# Patient Record
Sex: Female | Born: 1990
Health system: Southern US, Community
[De-identification: ages and names within clinical notes are randomized; demographics above are authoritative.]

## PROBLEM LIST (undated history)

## (undated) ENCOUNTER — Inpatient Hospital Stay (HOSPITAL_COMMUNITY): Payer: Self-pay

## (undated) DIAGNOSIS — N6009 Solitary cyst of unspecified breast: Secondary | ICD-10-CM

## (undated) DIAGNOSIS — D649 Anemia, unspecified: Secondary | ICD-10-CM

## (undated) DIAGNOSIS — F419 Anxiety disorder, unspecified: Secondary | ICD-10-CM

## (undated) DIAGNOSIS — F32A Depression, unspecified: Secondary | ICD-10-CM

## (undated) DIAGNOSIS — T7840XA Allergy, unspecified, initial encounter: Secondary | ICD-10-CM

## (undated) DIAGNOSIS — J45909 Unspecified asthma, uncomplicated: Secondary | ICD-10-CM

## (undated) HISTORY — DX: Depression, unspecified: F32.A

## (undated) HISTORY — PX: COSMETIC SURGERY: SHX468

## (undated) HISTORY — PX: BREAST BIOPSY: SHX20

## (undated) HISTORY — PX: NO PAST SURGERIES: SHX2092

## (undated) HISTORY — DX: Allergy, unspecified, initial encounter: T78.40XA

## (undated) HISTORY — DX: Anxiety disorder, unspecified: F41.9

---
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
## 2013-06-17 ENCOUNTER — Inpatient Hospital Stay (HOSPITAL_COMMUNITY): Payer: BC Managed Care – PPO

## 2013-06-17 ENCOUNTER — Encounter (HOSPITAL_COMMUNITY): Payer: Self-pay

## 2013-06-17 ENCOUNTER — Inpatient Hospital Stay (HOSPITAL_COMMUNITY)
Admission: AD | Admit: 2013-06-17 | Discharge: 2013-06-17 | Disposition: A | Discharge status: Home / Self Care | Payer: BC Managed Care – PPO | Source: Ambulatory Visit | Attending: Obstetrics and Gynecology | Admitting: Obstetrics and Gynecology

## 2013-06-17 DIAGNOSIS — R109 Unspecified abdominal pain: Secondary | ICD-10-CM

## 2013-06-17 DIAGNOSIS — O9989 Other specified diseases and conditions complicating pregnancy, childbirth and the puerperium: Secondary | ICD-10-CM

## 2013-06-17 DIAGNOSIS — O99891 Other specified diseases and conditions complicating pregnancy: Secondary | ICD-10-CM | POA: Insufficient documentation

## 2013-06-17 DIAGNOSIS — R1032 Left lower quadrant pain: Secondary | ICD-10-CM | POA: Insufficient documentation

## 2013-06-17 DIAGNOSIS — O26899 Other specified pregnancy related conditions, unspecified trimester: Secondary | ICD-10-CM

## 2013-06-17 HISTORY — DX: Unspecified asthma, uncomplicated: J45.909

## 2013-06-17 HISTORY — DX: Anemia, unspecified: D64.9

## 2013-06-17 LAB — URINALYSIS, ROUTINE W REFLEX MICROSCOPIC
Bilirubin Urine: NEGATIVE
Glucose, UA: NEGATIVE mg/dL
Ketones, ur: NEGATIVE mg/dL
Leukocytes, UA: NEGATIVE
Nitrite: NEGATIVE
Protein, ur: NEGATIVE mg/dL
Specific Gravity, Urine: <1.005 — ABNORMAL LOW (ref 1.005–1.030)
Urobilinogen, UA: 0.2 mg/dL (ref 0.0–1.0)
pH: 7 (ref 5.0–8.0)

## 2013-06-17 LAB — URINE MICROSCOPIC-ADD ON

## 2013-06-17 LAB — CBC
HCT: 38.4 % (ref 36.0–46.0)
Hemoglobin: 13.1 g/dL (ref 12.0–15.0)
MCH: 30.4 pg (ref 26.0–34.0)
MCHC: 34.1 g/dL (ref 30.0–36.0)
MCV: 89.1 fL (ref 78.0–100.0)
PLATELETS: 241 10*3/uL (ref 150–400)
RBC: 4.31 MIL/uL (ref 3.87–5.11)
RDW: 13 % (ref 11.5–15.5)
WBC: 10.2 10*3/uL (ref 4.0–10.5)

## 2013-06-17 LAB — POCT PREGNANCY, URINE: Preg Test, Ur: POSITIVE — AB

## 2013-06-17 LAB — WET PREP, GENITAL
CLUE CELLS WET PREP: NONE SEEN
Trich, Wet Prep: NONE SEEN
Yeast Wet Prep HPF POC: NONE SEEN

## 2013-06-17 LAB — ABO/RH: ABO/RH(D): O NEG

## 2013-06-17 LAB — HCG, QUANTITATIVE, PREGNANCY: HCG, BETA CHAIN, QUANT, S: 10071 m[IU]/mL — AB (ref <5)

## 2013-06-17 NOTE — Discharge Instructions (Signed)
Pregnancy, First Trimester °The first trimester is the first 3 months your baby is growing inside you. It is important to follow your doctor's instructions. °HOME CARE  °· Do not smoke. °· Do not drink alcohol. °· Only take medicine as told by your doctor. °· Exercise. °· Eat healthy foods. Eat regular, well-balanced meals. °· You can have sex (intercourse) if there are no other problems with the pregnancy. °· Things that help with morning sickness: °· Eat soda crackers before getting up in the morning. °· Eat 4 to 5 small meals rather than 3 large meals. °· Drink liquids between meals, not during meals. °· Go to all appointments as told. °· Take all vitamins or supplements as told by your doctor. °GET HELP RIGHT AWAY IF:  °· You develop a fever. °· You have a bad smelling fluid that is leaking from your vagina. °· There is bleeding from the vagina. °· You develop severe belly (abdominal) or back pain. °· You throw up (vomit) blood. It may look like coffee grounds. °· You lose more than 2 pounds in a week. °· You gain 5 pounds or more in a week. °· You gain more than 2 pounds in a week and you see puffiness (swelling) in your feet, ankles, or legs. °· You have severe dizziness or pass out (faint). °· You are around people who have German measles, chickenpox, or fifth disease. °· You have a headache, watery poop (diarrhea), pain with peeing (urinating), or cannot breath right. °Document Released: 06/19/2007 Document Revised: 03/25/2011 Document Reviewed: 06/19/2007 °ExitCare® Patient Information ©2014 [employer]. ° °

## 2013-06-17 NOTE — MAU Note (Signed)
Patient states she has had positive home pregnancy tests. Started having left lower abdominal pain on 6-2. Has gotten worse, sharp and more constant. Denies bleeding or discharge. Has had nausea, no vomiting.

## 2013-06-17 NOTE — MAU Provider Note (Signed)
History     CSN: [id number]  Arrival date and time: 06/17/13 0910   First Provider Initiated Contact with Patient 06/17/13 1020      Chief Complaint  Patient presents with  . Possible Pregnancy  . Abdominal Pain   HPI [name] is a 23 y.o. G1P0 at [redacted]w[redacted]d who presents to MAU today with complaint of lower abdominal pain in the LLQ. The patient states that she has had off and on cramping with sharp pains since Tuesday. She rates pain at 5/10 now and 7/10 at the worst. She denies vaginal bleeding, abnormal discharge, fever or UTI symptoms today. She states mild nausea without vomiting, diarrhea or constipation.   OB History   Grav Para Term Preterm Abortions TAB SAB Ect Mult Living   1               Past Medical History  Diagnosis Date  . Asthma   . Anemia     Past Surgical History  Procedure Laterality Date  . No past surgeries      Family History  Problem Relation Age of Onset  . Multiple sclerosis Mother     History  Substance Use Topics  . Smoking status: Never Smoker   . Smokeless tobacco: Not on file  . Alcohol Use: No    Allergies:  Allergies  Allergen Reactions  . Shellfish Allergy Hives    No prescriptions prior to admission    Review of Systems  Constitutional: Negative for fever and malaise/fatigue.  Gastrointestinal: Positive for nausea and abdominal pain. Negative for vomiting, diarrhea and constipation.  Genitourinary: Negative for dysuria, urgency and frequency.       Neg - vaginal bleeding, discharge  Neurological: Negative for dizziness, loss of consciousness and weakness.   Physical Exam   Blood pressure 100/57, pulse 91, temperature 99.4 F (37.4 C), temperature source Oral, resp. rate 18, height 5\' 2"  (1.575 m), weight 124 lb (56.246 kg), last menstrual period 05/10/2013, SpO2 100.00%.  Physical Exam  Constitutional: She is oriented to person, place, and time. She appears well-developed and well-nourished. No distress.   HENT:  Head: Normocephalic.  Cardiovascular: Normal rate.   Respiratory: Effort normal.  GI: Soft. She exhibits no distension and no mass. There is tenderness (mild epigastric tenderness to palpation). There is no rebound and no guarding.  Genitourinary: Uterus is not enlarged and not tender. Cervix exhibits friability. Cervix exhibits no motion tenderness and no discharge. Right adnexum displays no mass and no tenderness. Left adnexum displays tenderness (mild). Left adnexum displays no mass. No bleeding around the vagina. Vaginal discharge (scant mucus discharge) found.  Neurological: She is alert and oriented to person, place, and time.  Skin: Skin is warm and dry. No erythema.  Psychiatric: She has a normal mood and affect.   Results for orders placed during the hospital encounter of 06/17/13 (from the past 24 hour(s))  URINALYSIS, ROUTINE W REFLEX MICROSCOPIC     Status: Abnormal   Collection Time    06/17/13  9:45 AM      Result Value Ref Range   Color, Urine YELLOW  YELLOW   APPearance CLEAR  CLEAR   Specific Gravity, Urine <1.005 (*) 1.005 - 1.030   pH 7.0  5.0 - 8.0   Glucose, UA NEGATIVE  NEGATIVE mg/dL   Hgb urine dipstick TRACE (*) NEGATIVE   Bilirubin Urine NEGATIVE  NEGATIVE   Ketones, ur NEGATIVE  NEGATIVE mg/dL   Protein, ur NEGATIVE  NEGATIVE mg/dL  Urobilinogen, UA 0.2  0.0 - 1.0 mg/dL   Nitrite NEGATIVE  NEGATIVE   Leukocytes, UA NEGATIVE  NEGATIVE  URINE MICROSCOPIC-ADD ON     Status: None   Collection Time    06/17/13  9:45 AM      Result Value Ref Range   Squamous Epithelial / LPF RARE  RARE   WBC, UA 0-2  <3 WBC/hpf  POCT PREGNANCY, URINE     Status: Abnormal   Collection Time    06/17/13  9:51 AM      Result Value Ref Range   Preg Test, Ur POSITIVE (*) NEGATIVE  WET PREP, GENITAL     Status: Abnormal   Collection Time    06/17/13 10:29 AM      Result Value Ref Range   Yeast Wet Prep HPF POC NONE SEEN  NONE SEEN   Trich, Wet Prep NONE SEEN  NONE  SEEN   Clue Cells Wet Prep HPF POC NONE SEEN  NONE SEEN   WBC, Wet Prep HPF POC FEW (*) NONE SEEN  CBC     Status: None   Collection Time    06/17/13 11:15 AM      Result Value Ref Range   WBC 10.2  4.0 - 10.5 K/uL   RBC 4.31  3.87 - 5.11 MIL/uL   Hemoglobin 13.1  12.0 - 15.0 g/dL   HCT 16.1  09.6 - 04.5 %   MCV 89.1  78.0 - 100.0 fL   MCH 30.4  26.0 - 34.0 pg   MCHC 34.1  30.0 - 36.0 g/dL   RDW 40.9  81.1 - 91.4 %   Platelets 241  150 - 400 K/uL  ABO/RH     Status: None   Collection Time    06/17/13 11:15 AM      Result Value Ref Range   ABO/RH(D) O NEG    HCG, QUANTITATIVE, PREGNANCY     Status: Abnormal   Collection Time    06/17/13 11:15 AM      Result Value Ref Range   hCG, Beta Chain, Quant, S 10071 (*) <5 mIU/mL   US Ob Comp Less 14 Wks  06/17/2013   CLINICAL DATA:  Right lower quadrant pain  EXAM: OBSTETRIC <14 WK [place] AND TRANSVAGINAL OB US  TECHNIQUE: Both transabdominal and transvaginal ultrasound examinations were performed for complete evaluation of the gestation as well as the maternal uterus, adnexal regions, and pelvic cul-de-sac. Transvaginal technique was performed to assess early pregnancy.  COMPARISON:  None.  FINDINGS: Intrauterine gestational sac: Visualized/normal in shape.  Yolk sac:  Present.  Embryo:  None.  Cardiac Activity: None.  MSD: 9.7 mm   5 w   5  d                 [place] EDC: 02/12/2013  Maternal uterus/adnexae: There is no adnexal mass. Bilateral ovaries are normal in size and echogenicity. The right ovary measures 3.8 x 2.9 x 2.9 cm. The left ovary measures 3.8 x 1.9 x 1.9 cm. There is no pelvic free fluid. There is no subchorionic hemorrhage.  IMPRESSION: Intrauterine gestational sac with a yolk sac. No fetal pole identified. Probable early intrauterine gestational sac, but fetal pole, or cardiac activity yet visualized. Recommend follow-up quantitative B-HCG levels and follow-up US in 14 days to confirm and assess viability. This recommendation follows SRU  consensus guidelines: Diagnostic Criteria for Nonviable Pregnancy Early in the First Trimester. Malva Limes Med 2013; 782:9562-13.   Electronically  Signed   By: Elige KoHetal  Patel   On: 06/17/2013 11:15   [place] Ob Transvaginal  06/17/2013   CLINICAL DATA:  Right lower quadrant pain  EXAM: OBSTETRIC <14 WK US AND TRANSVAGINAL OB US  TECHNIQUE: Both transabdominal and transvaginal ultrasound examinations were performed for complete evaluation of the gestation as well as the maternal uterus, adnexal regions, and pelvic cul-de-sac. Transvaginal technique was performed to assess early pregnancy.  COMPARISON:  None.  FINDINGS: Intrauterine gestational sac: Visualized/normal in shape.  Yolk sac:  Present.  Embryo:  None.  Cardiac Activity: None.  MSD: 9.7 mm   5 w   5  d                 US EDC: 02/12/2013  Maternal uterus/adnexae: There is no adnexal mass. Bilateral ovaries are normal in size and echogenicity. The right ovary measures 3.8 x 2.9 x 2.9 cm. The left ovary measures 3.8 x 1.9 x 1.9 cm. There is no pelvic free fluid. There is no subchorionic hemorrhage.  IMPRESSION: Intrauterine gestational sac with a yolk sac. No fetal pole identified. Probable early intrauterine gestational sac, but fetal pole, or cardiac activity yet visualized. Recommend follow-up quantitative B-HCG levels and follow-up US in 14 days to confirm and assess viability. This recommendation follows SRU consensus guidelines: Diagnostic Criteria for Nonviable Pregnancy Early in the First Trimester. Malva Limes Engl J Med 2013; 409:8119-14; 369:1443-51.   Electronically Signed   By: Elige KoHetal  Patel   On: 06/17/2013 11:15    MAU Course  Procedures None  MDM +UPT UA, wet prep, GC/Chlamydia, CBC, ABO/Rh, quant hCG and US today Discussed results and patient with Dr. Henderson CloudHorvath. Ok for discharge. Follow-up in the office as scheduled or sooner if pain worsens.  Dr. Henderson CloudHorvath states that patient will need follow-up US in the office Assessment and Plan  A: IUGS and YS at  3371w5d Abdominal pain in pregnancy  P: Discharge home Recommended Tylenol PRN pain Patient advised to follow-up as scheduled in the office or sooner if symptoms worsen Patient may return to MAU as needed or if her condition were to change or worsen  Freddi StarrJulie N Ethier, PA-C  06/17/2013, 2:56 PM

## 2013-06-19 LAB — GC/CHLAMYDIA PROBE AMP
CT PROBE, AMP APTIMA: NEGATIVE
GC PROBE AMP APTIMA: NEGATIVE

## 2013-07-26 LAB — OB RESULTS CONSOLE RUBELLA ANTIBODY, IGM: Rubella: IMMUNE

## 2013-07-26 LAB — OB RESULTS CONSOLE HIV ANTIBODY (ROUTINE TESTING): HIV: NONREACTIVE

## 2013-07-26 LAB — OB RESULTS CONSOLE HEPATITIS B SURFACE ANTIGEN: Hepatitis B Surface Ag: NEGATIVE

## 2013-07-26 LAB — OB RESULTS CONSOLE RPR: RPR: NONREACTIVE

## 2013-10-12 LAB — OB RESULTS CONSOLE GC/CHLAMYDIA
Chlamydia: NEGATIVE
Gonorrhea: NEGATIVE

## 2013-11-10 ENCOUNTER — Encounter (HOSPITAL_COMMUNITY): Payer: Self-pay | Admitting: General Practice

## 2013-11-10 ENCOUNTER — Inpatient Hospital Stay (HOSPITAL_COMMUNITY)
Admission: AD | Admit: 2013-11-10 | Discharge: 2013-11-10 | Disposition: A | Discharge status: Home / Self Care | Payer: 59 | Source: Ambulatory Visit | Attending: Obstetrics | Admitting: Obstetrics

## 2013-11-10 DIAGNOSIS — Z3A26 26 weeks gestation of pregnancy: Secondary | ICD-10-CM | POA: Insufficient documentation

## 2013-11-10 DIAGNOSIS — O368120 Decreased fetal movements, second trimester, not applicable or unspecified: Secondary | ICD-10-CM | POA: Insufficient documentation

## 2013-11-10 DIAGNOSIS — O26892 Other specified pregnancy related conditions, second trimester: Secondary | ICD-10-CM | POA: Diagnosis not present

## 2013-11-10 DIAGNOSIS — N898 Other specified noninflammatory disorders of vagina: Secondary | ICD-10-CM | POA: Diagnosis not present

## 2013-11-10 LAB — WET PREP, GENITAL
Clue Cells Wet Prep HPF POC: NONE SEEN
Trich, Wet Prep: NONE SEEN
YEAST WET PREP: NONE SEEN

## 2013-11-10 LAB — URINALYSIS, ROUTINE W REFLEX MICROSCOPIC
BILIRUBIN URINE: NEGATIVE
Glucose, UA: NEGATIVE mg/dL
HGB URINE DIPSTICK: NEGATIVE
KETONES UR: NEGATIVE mg/dL
Leukocytes, UA: NEGATIVE
Nitrite: NEGATIVE
Protein, ur: NEGATIVE mg/dL
SPECIFIC GRAVITY, URINE: 1.01 (ref 1.005–1.030)
UROBILINOGEN UA: 0.2 mg/dL (ref 0.0–1.0)
pH: 7.5 (ref 5.0–8.0)

## 2013-11-10 LAB — AMNISURE RUPTURE OF MEMBRANE (ROM) NOT AT ARMC: Amnisure ROM: NEGATIVE

## 2013-11-10 NOTE — MAU Note (Signed)
Pt presents to mau with c/o decreased fetal movement and leaking fluid.

## 2013-11-10 NOTE — MAU Provider Note (Signed)
History     CSN: [id number]  Arrival date and time: 11/10/13 1131   First Provider Initiated Contact with Patient 11/10/13 1322      Chief Complaint  Patient presents with  . Decreased Fetal Movement   HPI  Alison Moss is a 23 y.o. female G1P0 at 6378w2d who presents with ? ROM and decreased fetal movement. This morning she urinated and when she stood up she felt a gush of fluid; this happened twice. She wore a panty liner into the hospital and it was wet when she came into MAU.  She is feeling fetal movement now that she is here. She was concerned with the babies movements this morning because he is normally very active and this morning he was not. She denies vaginal bleeding.   OB History   Grav Para Term Preterm Abortions TAB SAB Ect Mult Living   1               Past Medical History  Diagnosis Date  . Asthma   . Anemia     Past Surgical History  Procedure Laterality Date  . No past surgeries      Family History  Problem Relation Age of Onset  . Multiple sclerosis Mother     History  Substance Use Topics  . Smoking status: Never Smoker   . Smokeless tobacco: Not on file  . Alcohol Use: No    Allergies:  Allergies  Allergen Reactions  . Shellfish Allergy Hives    Prescriptions prior to admission  Medication Sig Dispense Refill  . Omega-3 Fatty Acids (OMEGA III EPA+DHA) 1000 MG CAPS Take 1 capsule by mouth daily.      . Prenatal Vit-Fe Fumarate-FA (PRENATAL MULTIVITAMIN) TABS tablet Take 1 tablet by mouth daily at 12 noon.       Results for orders placed during the hospital encounter of 11/10/13 (from the past 48 hour(s))  URINALYSIS, ROUTINE W REFLEX MICROSCOPIC     Status: None   Collection Time    11/10/13 12:11 PM      Result Value Ref Range   Color, Urine YELLOW  YELLOW   APPearance CLEAR  CLEAR   Specific Gravity, Urine 1.010  1.005 - 1.030   pH 7.5  5.0 - 8.0   Glucose, UA NEGATIVE  NEGATIVE mg/dL   Hgb urine dipstick NEGATIVE   NEGATIVE   Bilirubin Urine NEGATIVE  NEGATIVE   Ketones, ur NEGATIVE  NEGATIVE mg/dL   Protein, ur NEGATIVE  NEGATIVE mg/dL   Urobilinogen, UA 0.2  0.0 - 1.0 mg/dL   Nitrite NEGATIVE  NEGATIVE   Leukocytes, UA NEGATIVE  NEGATIVE   Comment: MICROSCOPIC NOT DONE ON URINES WITH NEGATIVE PROTEIN, BLOOD, LEUKOCYTES, NITRITE, OR GLUCOSE <1000 mg/dL.  AMNISURE RUPTURE OF MEMBRANE (ROM)     Status: None   Collection Time    11/10/13  1:37 PM      Result Value Ref Range   Amnisure ROM NEGATIVE    WET PREP, GENITAL     Status: Abnormal   Collection Time    11/10/13  1:37 PM      Result Value Ref Range   Yeast Wet Prep HPF POC NONE SEEN  NONE SEEN   Trich, Wet Prep NONE SEEN  NONE SEEN   Clue Cells Wet Prep HPF POC NONE SEEN  NONE SEEN   WBC, Wet Prep HPF POC FEW (*) NONE SEEN   Comment: MANY BACTERIA SEEN    Review of Systems  Gastrointestinal: Negative for abdominal pain (Occasional tightening ).  Genitourinary: Negative for dysuria, urgency and frequency.       + leaking of fluid; clear.    Physical Exam   Blood pressure 105/63, pulse 96, temperature 98 F (36.7 C), temperature source Oral, resp. rate 18, height 5' (1.524 m), weight 58.786 kg (129 lb 9.6 oz), last menstrual period 05/10/2013.  Physical Exam  Constitutional: She is oriented to person, place, and time. She appears well-developed and well-nourished.  HENT:  Head: Normocephalic and atraumatic.  Eyes: Pupils are equal, round, and reactive to light.  Neck: Neck supple.  Respiratory: Effort normal.  GI: Soft. She exhibits no distension. There is no tenderness.  Genitourinary:  Speculum exam: Vagina - moderate amount of creamy discharge, no odor. No pooling of fluid in the vaginal canal  Cervix - No contact bleeding Bimanual exam: Cervix: external os FT, internal os closed   wet prep done Chaperone present for exam.   Neurological: She is alert and oriented to person, place, and time.  Skin: Skin is warm.   Psychiatric: Her behavior is normal.   Fetal Tracing: Baseline: 130 bpm  Variability: Moderate  Accelerations: 15x15 Decelerations: None Toco: None   MAU Course  Procedures None  MDM Wet prep Fern slide negative Amnisure negative  Discussed/ consulted with Dr. Chestine Sporelark. Dr. Chestine Sporelark reviewed fetal tracing. Amnisure negative. Ok to discharge the patient home.   Assessment and Plan   A: 1. Vaginal discharge in pregnancy in second trimester    P: Discharge home in stable condition Return to MAU if symptoms worsen Kick counts Follow up with Dr. Chestine Sporelark as scheduled  Preterm labor precautions   Alison HansenJennifer Irene Cele Mote, NP 11/10/2013 3:34 PM

## 2013-11-15 ENCOUNTER — Encounter (HOSPITAL_COMMUNITY): Payer: Self-pay | Admitting: General Practice

## 2013-12-28 ENCOUNTER — Inpatient Hospital Stay (HOSPITAL_COMMUNITY)
Admission: AD | Admit: 2013-12-28 | Discharge: 2013-12-28 | Disposition: A | Discharge status: Home / Self Care | Payer: 59 | Source: Ambulatory Visit | Attending: Obstetrics and Gynecology | Admitting: Obstetrics and Gynecology

## 2013-12-28 ENCOUNTER — Encounter (HOSPITAL_COMMUNITY): Payer: Self-pay | Admitting: *Deleted

## 2013-12-28 DIAGNOSIS — Z3A33 33 weeks gestation of pregnancy: Secondary | ICD-10-CM | POA: Insufficient documentation

## 2013-12-28 DIAGNOSIS — O4703 False labor before 37 completed weeks of gestation, third trimester: Secondary | ICD-10-CM

## 2013-12-28 LAB — URINALYSIS, ROUTINE W REFLEX MICROSCOPIC
Bilirubin Urine: NEGATIVE
Glucose, UA: NEGATIVE mg/dL
Hgb urine dipstick: NEGATIVE
KETONES UR: NEGATIVE mg/dL
Leukocytes, UA: NEGATIVE
Nitrite: NEGATIVE
PH: 7 (ref 5.0–8.0)
Protein, ur: NEGATIVE mg/dL
Specific Gravity, Urine: 1.01 (ref 1.005–1.030)
Urobilinogen, UA: 0.2 mg/dL (ref 0.0–1.0)

## 2013-12-28 LAB — WET PREP, GENITAL
Clue Cells Wet Prep HPF POC: NONE SEEN
Trich, Wet Prep: NONE SEEN
YEAST WET PREP: NONE SEEN

## 2013-12-28 MED ORDER — LACTATED RINGERS IV BOLUS (SEPSIS)
1000.0000 mL | Freq: Once | INTRAVENOUS | Status: AC
  Administered 2013-12-28: 1000 mL via INTRAVENOUS

## 2013-12-28 MED ORDER — NIFEDIPINE 10 MG PO CAPS
10.0000 mg | ORAL_CAPSULE | ORAL | Status: AC | PRN
  Administered 2013-12-28 (×4): 10 mg via ORAL
  Filled 2013-12-28 (×4): qty 1

## 2013-12-28 MED ORDER — BETAMETHASONE SOD PHOS & ACET 6 (3-3) MG/ML IJ SUSP
12.0000 mg | Freq: Once | INTRAMUSCULAR | Status: AC
  Administered 2013-12-28: 12 mg via INTRAMUSCULAR
  Filled 2013-12-28: qty 2

## 2013-12-28 NOTE — MAU Note (Signed)
Pt. Here for evaluation. Unsure about contraction frequency. Was seen by Dr. Henderson CloudHorvath today and was told to come here and be evaluated. Pt. Also states she was possibly a fingertip on exam. Denies any bleeding. Did have spotting last week. Pt. States she is having "regular discharge" and last week possibly lost mucous. Denies any other abnormal discharge. Baby is moving well. Next appointment is scheduled in two weeks with primary provider.

## 2013-12-28 NOTE — Discharge Instructions (Signed)
Preterm Labor Information °Preterm labor is when labor starts at less than 37 weeks of pregnancy. The normal length of a pregnancy is 39 to 41 weeks. °CAUSES °Often, there is no identifiable underlying cause as to why a woman goes into preterm labor. One of the most common known causes of preterm labor is infection. Infections of the uterus, cervix, vagina, amniotic sac, bladder, kidney, or even the lungs (pneumonia) can cause labor to start. Other suspected causes of preterm labor include:  °· Urogenital infections, such as yeast infections and bacterial vaginosis.   °· Uterine abnormalities (uterine shape, uterine septum, fibroids, or bleeding from the placenta).   °· A cervix that has been operated on (it may fail to stay closed).   °· Malformations in the fetus.   °· Multiple gestations (twins, triplets, and so on).   °· Breakage of the amniotic sac.   °RISK FACTORS °· Having a previous history of preterm labor.   °· Having premature rupture of membranes (PROM).   °· Having a placenta that covers the opening of the cervix (placenta previa).   °· Having a placenta that separates from the uterus (placental abruption).   °· Having a cervix that is too weak to hold the fetus in the uterus (incompetent cervix).   °· Having too much fluid in the amniotic sac (polyhydramnios).   °· Taking illegal drugs or smoking while pregnant.   °· Not gaining enough weight while pregnant.   °· Being younger than 18 and older than 23 years old.   °· Having a low socioeconomic status.   °· Being African American. °SYMPTOMS °Signs and symptoms of preterm labor include:  °· Menstrual-like cramps, abdominal pain, or back pain. °· Uterine contractions that are regular, as frequent as six in an hour, regardless of their intensity (may be mild or painful). °· Contractions that start on the top of the uterus and spread down to the lower abdomen and back.   °· A sense of increased pelvic pressure.   °· A watery or bloody mucus discharge that  comes from the vagina.   °TREATMENT °Depending on the length of the pregnancy and other circumstances, your health care provider may suggest bed rest. If necessary, there are medicines that can be given to stop contractions and to mature the fetal lungs. If labor happens before 34 weeks of pregnancy, a prolonged hospital stay may be recommended. Treatment depends on the condition of both you and the fetus.  °WHAT SHOULD YOU DO IF YOU THINK YOU ARE IN PRETERM LABOR? °Call your health care provider right away. You will need to go to the hospital to get checked immediately. °HOW CAN YOU PREVENT PRETERM LABOR IN FUTURE PREGNANCIES? °You should:  °· Stop smoking if you smoke.  °· Maintain healthy weight gain and avoid chemicals and drugs that are not necessary. °· Be watchful for any type of infection. °· Inform your health care provider if you have a known history of preterm labor. °Document Released: 03/23/2003 Document Revised: 09/02/2012 Document Reviewed: 02/03/2012 °ExitCare® Patient Information ©2015 [employer]. This information is not intended to replace advice given to you by your health care provider. Make sure you discuss any questions you have with your health care provider. ° °Pelvic Rest °Pelvic rest is sometimes recommended for women when:  °· The placenta is partially or completely covering the opening of the cervix (placenta previa). °· There is bleeding between the uterine wall and the amniotic sac in the first trimester (subchorionic hemorrhage). °· The cervix begins to open without labor starting (incompetent cervix, cervical insufficiency). °· The labor is too early (preterm   labor). °HOME CARE INSTRUCTIONS °· Do not have sexual intercourse, stimulation, or an orgasm. °· Do not use tampons, douche, or put anything in the vagina. °· Do not lift anything over 10 pounds (4.5 kg). °· Avoid strenuous activity or straining your pelvic muscles. °SEEK MEDICAL CARE IF:  °· You have any vaginal bleeding  during pregnancy. Treat this as a potential emergency. °· You have cramping pain felt low in the stomach (stronger than menstrual cramps). °· You notice vaginal discharge (watery, mucus, or bloody). °· You have a low, dull backache. °· There are regular contractions or uterine tightening. °SEEK IMMEDIATE MEDICAL CARE IF: °You have vaginal bleeding and have placenta previa.  °Document Released: 04/27/2010 Document Revised: 03/25/2011 Document Reviewed: 04/27/2010 °ExitCare® Patient Information ©2015 [employer]. This information is not intended to replace advice given to you by your health care provider. Make sure you discuss any questions you have with your health care provider. ° °

## 2013-12-28 NOTE — MAU Note (Signed)
Pt states was at MD office today for routine appt. Was told she was ft dilated per Dr. Henderson CloudHorvath. Has had irregular contractions. Has seen mucus like d/c and some spotting.

## 2013-12-28 NOTE — MAU Provider Note (Signed)
Chief Complaint:  Contractions  First Provider Initiated Contact with Patient 12/28/13 1832     HPI: Alison Moss is a 23 y.o. G1P0 at 5219w1d who [facility]. Was seen by Dr. Henderson CloudHorvath today.  Cervix ft/1 cm/short. Pt reports UC's x 1 week. Denies fever, chills, vaginal discharge, leakage of fluid or vaginal bleeding. Good fetal movement.   UA neg in office today. fFN not done.   Past Medical History: Past Medical History  Diagnosis Date  . Asthma   . Anemia     Past obstetric history: OB History  Gravida Para Term Preterm AB SAB TAB Ectopic Multiple Living  1             # Outcome Date GA Lbr Len/2nd Weight Sex Delivery Anes PTL Lv  1 Current               Past Surgical History: Past Surgical History  Procedure Laterality Date  . No past surgeries       Family History: Family History  Problem Relation Age of Onset  . Multiple sclerosis Mother     Social History: History  Substance Use Topics  . Smoking status: Never Smoker   . Smokeless tobacco: Not on file  . Alcohol Use: No    Allergies:  Allergies  Allergen Reactions  . Shellfish Allergy Hives    Meds:  Prescriptions prior to admission  Medication Sig Dispense Refill Last Dose  . Omega-3 Fatty Acids (OMEGA III EPA+DHA) 1000 MG CAPS Take 1 capsule by mouth daily.   Past Week at Unknown time  . Prenatal Vit-Fe Fumarate-FA (PRENATAL MULTIVITAMIN) TABS tablet Take 1 tablet by mouth daily at 12 noon.   12/27/2013 at Unknown time    ROS: Pertinent findings in history of present illness. Denies fever, chills, vaginal discharge, leakage of fluid, vaginal bleeding, or urinary complaints.  Physical Exam  Blood pressure 104/64, pulse 83, temperature 97.7 F (36.5 C), temperature source Oral, resp. rate 18, last menstrual period 05/10/2013. GENERAL: Well-developed, well-nourished female in no acute distress.  HEENT: normocephalic HEART: normal  rate RESP: normal effort ABDOMEN: Soft, non-tender, gravid appropriate for gestational age EXTREMITIES: Nontender, no edema NEURO: alert and oriented SPECULUM EXAM: Deferred  Dilation: 1.5 Cervical Position: Posterior Exam by:: Ivonne AndrewV. Trevonne Nyland, CNM1/50/-3, posterior, soft, VTX  FHT:  Baseline 140 , moderate variability, accelerations present, no decelerations Contractions: q 1.5-2 mins   Labs: Results for orders placed or performed during the hospital encounter of 12/28/13 (from the past 24 hour(s))  Urinalysis, Routine w reflex microscopic     Status: None   Collection Time: 12/28/13  5:33 PM  Result Value Ref Range   Color, Urine YELLOW YELLOW   APPearance CLEAR CLEAR   Specific Gravity, Urine 1.010 1.005 - 1.030   pH 7.0 5.0 - 8.0   Glucose, UA NEGATIVE NEGATIVE mg/dL   Hgb urine dipstick NEGATIVE NEGATIVE   Bilirubin Urine NEGATIVE NEGATIVE   Ketones, ur NEGATIVE NEGATIVE mg/dL   Protein, ur NEGATIVE NEGATIVE mg/dL   Urobilinogen, UA 0.2 0.0 - 1.0 mg/dL   Nitrite NEGATIVE NEGATIVE   Leukocytes, UA NEGATIVE NEGATIVE  Wet prep, genital     Status: Abnormal   Collection Time: 12/28/13  6:40 PM  Result Value Ref Range   Yeast Wet Prep HPF POC NONE SEEN NONE SEEN   Trich, Wet Prep NONE SEEN NONE SEEN   Clue Cells Wet Prep HPF POC NONE SEEN NONE  SEEN   WBC, Wet Prep HPF POC FEW (A) NONE SEEN    Imaging:  No results found.   MAU Course: BMZ, Procardia, push fluids.   UC's much less frequent and intense. Now tracing only frequent UI. Dilation: 1.5 Cervical Position: Posterior Exam by:: Ivonne AndrewV. Ahren Pettinger, CNM  Dr. Dareen PianoAnderson notified for small cervical change. IV bolus ordered.  Not feeling UC's.  No further cervical change.  Assessment: 1. Preterm contractions, third trimester    Plan: Discharge home in stable condition per consult w/ Dr. Dareen PianoAnderson.  Preterm Labor precautions and fetal kick counts. Push fluids and rest.       Follow-up Information    [facility] On 12[facility]/2013.   Why:  at 6:45 pm for second steroid injection   Contact information:   [address] [id number] [place] [id number] [phone]      Follow up with Levi AlandANDERSON,MARK E, MD.   Specialty:  Obstetrics and Gynecology   Why:  to schedule next appointment   Contact information:   [address] [place] [id number] [phone]        Medication List    TAKE these medications        OMEGA III EPA+DHA 1000 MG Caps  Take 1 capsule by mouth daily.     prenatal multivitamin Tabs tablet  Take 1 tablet by mouth daily at 12 noon.        [place]ole[place]ia Kassy Mcenroe, CNM 12/28/2013 9:08 PM

## 2013-12-28 NOTE — MAU Note (Signed)
[name], CNM at bedside. IV infusing well. Able to be discharged after IV is complete. Pt. And SO agreeable to plan of care.

## 2013-12-29 ENCOUNTER — Inpatient Hospital Stay (HOSPITAL_COMMUNITY)
Admission: AD | Admit: 2013-12-29 | Discharge: 2013-12-29 | Disposition: A | Discharge status: Home / Self Care | Payer: 59 | Source: Ambulatory Visit | Attending: Obstetrics and Gynecology | Admitting: Obstetrics and Gynecology

## 2013-12-29 DIAGNOSIS — Z3A33 33 weeks gestation of pregnancy: Secondary | ICD-10-CM | POA: Diagnosis not present

## 2013-12-29 MED ORDER — BETAMETHASONE SOD PHOS & ACET 6 (3-3) MG/ML IJ SUSP
12.0000 mg | Freq: Once | INTRAMUSCULAR | Status: AC
  Administered 2013-12-29: 12 mg via INTRAMUSCULAR
  Filled 2013-12-29: qty 2

## 2013-12-29 NOTE — MAU Note (Signed)
Pt presents for second betamethasone injection. Denies pain or bleeding. Reports good fetal movement.

## 2013-12-30 ENCOUNTER — Encounter (HOSPITAL_COMMUNITY): Payer: Self-pay

## 2013-12-30 ENCOUNTER — Inpatient Hospital Stay (HOSPITAL_COMMUNITY)
Admission: AD | Admit: 2013-12-30 | Discharge: 2013-12-30 | Disposition: A | Discharge status: Home / Self Care | Payer: 59 | Source: Ambulatory Visit | Attending: Obstetrics and Gynecology | Admitting: Obstetrics and Gynecology

## 2013-12-30 DIAGNOSIS — O368130 Decreased fetal movements, third trimester, not applicable or unspecified: Secondary | ICD-10-CM | POA: Insufficient documentation

## 2013-12-30 DIAGNOSIS — O9989 Other specified diseases and conditions complicating pregnancy, childbirth and the puerperium: Secondary | ICD-10-CM | POA: Diagnosis present

## 2013-12-30 DIAGNOSIS — O4703 False labor before 37 completed weeks of gestation, third trimester: Secondary | ICD-10-CM

## 2013-12-30 DIAGNOSIS — Z3A33 33 weeks gestation of pregnancy: Secondary | ICD-10-CM | POA: Insufficient documentation

## 2013-12-30 LAB — URINALYSIS, ROUTINE W REFLEX MICROSCOPIC
Bilirubin Urine: NEGATIVE
GLUCOSE, UA: NEGATIVE mg/dL
Ketones, ur: 15 mg/dL — AB
LEUKOCYTES UA: NEGATIVE
NITRITE: NEGATIVE
PROTEIN: NEGATIVE mg/dL
Specific Gravity, Urine: 1.01 (ref 1.005–1.030)
Urobilinogen, UA: 0.2 mg/dL (ref 0.0–1.0)
pH: 6 (ref 5.0–8.0)

## 2013-12-30 LAB — URINE MICROSCOPIC-ADD ON

## 2013-12-30 LAB — AMNISURE RUPTURE OF MEMBRANE (ROM) NOT AT ARMC: Amnisure ROM: NEGATIVE

## 2013-12-30 LAB — FETAL FIBRONECTIN: FETAL FIBRONECTIN: NEGATIVE

## 2013-12-30 MED ORDER — LACTATED RINGERS IV BOLUS (SEPSIS)
1000.0000 mL | Freq: Once | INTRAVENOUS | Status: AC
  Administered 2013-12-30: 1000 mL via INTRAVENOUS

## 2013-12-30 MED ORDER — NIFEDIPINE 10 MG PO CAPS
20.0000 mg | ORAL_CAPSULE | Freq: Once | ORAL | Status: AC
Start: 2013-12-30 — End: 2013-12-30
  Administered 2013-12-30: 20 mg via ORAL
  Filled 2013-12-30: qty 2

## 2013-12-30 MED ORDER — NIFEDIPINE 10 MG PO CAPS: 10.0000 mg | ORAL_CAPSULE | ORAL | Status: DC | PRN

## 2013-12-30 NOTE — MAU Note (Signed)
Pt states still feels moisture in vaginal area at present. Woke up with wet panties. Feels like she can feel cervix dilating.

## 2013-12-30 NOTE — Discharge Instructions (Signed)
Pelvic Rest °Pelvic rest is sometimes recommended for women when:  °· The placenta is partially or completely covering the opening of the cervix (placenta previa). °· There is bleeding between the uterine wall and the amniotic sac in the first trimester (subchorionic hemorrhage). °· The cervix begins to open without labor starting (incompetent cervix, cervical insufficiency). °· The labor is too early (preterm labor). °HOME CARE INSTRUCTIONS °· Do not have sexual intercourse, stimulation, or an orgasm. °· Do not use tampons, douche, or put anything in the vagina. °· Do not lift anything over 10 pounds (4.5 kg). °· Avoid strenuous activity or straining your pelvic muscles. °SEEK MEDICAL CARE IF:  °· You have any vaginal bleeding during pregnancy. Treat this as a potential emergency. °· You have cramping pain felt low in the stomach (stronger than menstrual cramps). °· You notice vaginal discharge (watery, mucus, or bloody). °· You have a low, dull backache. °· There are regular contractions or uterine tightening. °SEEK IMMEDIATE MEDICAL CARE IF: °You have vaginal bleeding and have placenta previa.  °Document Released: 04/27/2010 Document Revised: 03/25/2011 Document Reviewed: 04/27/2010 °ExitCare® Patient Information ©2015 [employer]. This information is not intended to replace advice given to you by your health care provider. Make sure you discuss any questions you have with your health care provider. ° °Fetal Fibronectin °This is a test done to help evaluate a pregnant woman's risk of pre-term delivery. It is generally done when you are 26 to [redacted] weeks pregnant and are having symptoms of premature labor. A Dacron swab is used to take a sample of cervical or vaginal fluid from the back portion of the vagina or from the area just outside the opening of the cervix. °Fetal fibronectin (fFN) is a glycoprotein that can be used to help predict the short term risk of premature delivery. fFN is produced at the boundary  between the amniotic sac and the lining of the mother's uterus. This is called the unteroplacental junction. Fetal fibronectin is largely confined to this junction and thought to help maintain the integrity of the boundary. fFN is normally detectable in cervicovaginal fluid during the first 20 to 24 weeks of pregnancy, and then is detectable again after about 36 weeks.  °Finding fFN in cervicovaginal fluids after 36 weeks is not unusual as it is often released by the body as it gets ready for childbirth. The elevated fFN found in vaginal fluids early in pregnancy may simply reflect the normal growth and establishment of tissues at the unteroplacental junction with levels falling when this phase is complete. What is known is that fFN that is detected between 24 and 36 weeks of pregnancy is not normal. Elevated levels reflect a disturbance at the uteroplacental junction and have been associated with an increased risk of pre-term labor and delivery. Knowing whether or not a woman is likely to deliver prematurely helps your caregiver plan a course of action. The fFN test is a relatively non-invasive tool to help the caregiver to distinguish between those who are likely to deliver shortly and those who are not.  °PREPARATION FOR TEST  °· Inform the person conducting the test if you have a medical condition or are using any medications that cause excessive bleeding. °· Do not have sexual intercourse for 24 hours before the procedure. °NORMAL FINDINGS  °Pregnancy = 50 nanograms/ml °Ranges for normal findings may vary among different laboratories and hospitals. You should always check with your doctor after having lab work or other tests done to discuss the meaning   of your test results and whether your values are considered within normal limits. °MEANING OF TEST  °Your caregiver will go over the test results with you and discuss the importance and meaning of your results, as well as treatment options and the need for  additional tests if necessary. °OBTAINING THE TEST RESULTS  °It is your responsibility to obtain your test results. Ask the lab or department performing the test when and how you will get your results. °Document Released: 11/02/2003 Document Revised: 03/25/2011 Document Reviewed: 03/29/2013 °ExitCare® Patient Information ©2015 [employer]. This information is not intended to replace advice given to you by your health care provider. Make sure you discuss any questions you have with your health care provider. ° °Preterm Labor Information °Preterm labor is when labor starts at less than 37 weeks of pregnancy. The normal length of a pregnancy is 39 to 41 weeks. °CAUSES °Often, there is no identifiable underlying cause as to why a woman goes into preterm labor. One of the most common known causes of preterm labor is infection. Infections of the uterus, cervix, vagina, amniotic sac, bladder, kidney, or even the lungs (pneumonia) can cause labor to start. Other suspected causes of preterm labor include:  °· Urogenital infections, such as yeast infections and bacterial vaginosis.   °· Uterine abnormalities (uterine shape, uterine septum, fibroids, or bleeding from the placenta).   °· A cervix that has been operated on (it may fail to stay closed).   °· Malformations in the fetus.   °· Multiple gestations (twins, triplets, and so on).   °· Breakage of the amniotic sac.   °RISK FACTORS °· Having a previous history of preterm labor.   °· Having premature rupture of membranes (PROM).   °· Having a placenta that covers the opening of the cervix (placenta previa).   °· Having a placenta that separates from the uterus (placental abruption).   °· Having a cervix that is too weak to hold the fetus in the uterus (incompetent cervix).   °· Having too much fluid in the amniotic sac (polyhydramnios).   °· Taking illegal drugs or smoking while pregnant.   °· Not gaining enough weight while pregnant.   °· Being younger than 18 and older  than 23 years old.   °· Having a low socioeconomic status.   °· Being African American. °SYMPTOMS °Signs and symptoms of preterm labor include:  °· Menstrual-like cramps, abdominal pain, or back pain. °· Uterine contractions that are regular, as frequent as six in an hour, regardless of their intensity (may be mild or painful). °· Contractions that start on the top of the uterus and spread down to the lower abdomen and back.   °· A sense of increased pelvic pressure.   °· A watery or bloody mucus discharge that comes from the vagina.   °TREATMENT °Depending on the length of the pregnancy and other circumstances, your health care provider may suggest bed rest. If necessary, there are medicines that can be given to stop contractions and to mature the fetal lungs. If labor happens before 34 weeks of pregnancy, a prolonged hospital stay may be recommended. Treatment depends on the condition of both you and the fetus.  °WHAT SHOULD YOU DO IF YOU THINK YOU ARE IN PRETERM LABOR? °Call your health care provider right away. You will need to go to the hospital to get checked immediately. °HOW CAN YOU PREVENT PRETERM LABOR IN FUTURE PREGNANCIES? °You should:  °· Stop smoking if you smoke.  °· Maintain healthy weight gain and avoid chemicals and drugs that are not necessary. °· Be watchful for any type of   infection. °· Inform your health care provider if you have a known history of preterm labor. °Document Released: 03/23/2003 Document Revised: 09/02/2012 Document Reviewed: 02/03/2012 °ExitCare® Patient Information ©2015 [employer]. This information is not intended to replace advice given to you by your health care provider. Make sure you discuss any questions you have with your health care provider. ° °

## 2013-12-30 NOTE — MAU Note (Signed)
Pt was seen on Monday for preterm ctx. Ctx stopped  And went home. Pt stated she woke up today and her panties were wet. C/o some lower back pain and "pinching on her cervix". Also c/o decreased fetal movment. MD told her to come in.

## 2013-12-30 NOTE — MAU Provider Note (Signed)
History     CSN: [id number]  Arrival date and time: 12/30/13 1119   First Provider Initiated Contact with Patient 12/30/13 1223      Chief Complaint  Patient presents with  . Rupture of Membranes   HPI Alison Moss 23 y.o. G1P0 @[redacted]w[redacted]d  presents to MAU with complaint of continuing contractions and possible leakage of fluid. When she awoke this am, she noticed dampness in underwear and has been wearing a panty liner all day.  She reports every time she changes to get dry, it becomes wet again right away.  No gush or stream of fluid.  No vaginal bleeding since spotting last week.  No dysuria.  Decreased fetal movement today.  She is beginning to feel movement while we talk.  She denies fever and weakness but feels flushed.   OB History    Gravida Para Term Preterm AB TAB SAB Ectopic Multiple Living   1               Past Medical History  Diagnosis Date  . Asthma   . Anemia     Past Surgical History  Procedure Laterality Date  . No past surgeries      Family History  Problem Relation Age of Onset  . Multiple sclerosis Mother     History  Substance Use Topics  . Smoking status: Never Smoker   . Smokeless tobacco: Never Used  . Alcohol Use: No    Allergies:  Allergies  Allergen Reactions  . Shellfish Allergy Hives    Prescriptions prior to admission  Medication Sig Dispense Refill Last Dose  . Omega-3 Fatty Acids (OMEGA III EPA+DHA) 1000 MG CAPS Take 1 capsule by mouth daily.   Past Week at Unknown time  . Prenatal Vit-Fe Fumarate-FA (PRENATAL MULTIVITAMIN) TABS tablet Take 1 tablet by mouth daily at 12 noon.   12/27/2013 at Unknown time    ROS Pertinent ROS in HPI Physical Exam   Blood pressure 110/54, pulse 92, temperature 98.1 F (36.7 C), resp. rate 18, height 5\' 1"  (1.549 m), weight 137 lb 3.2 oz (62.234 kg), last menstrual period 05/10/2013.  Physical Exam  Constitutional: She is oriented to person, place, and time. She appears well-developed and  well-nourished.  HENT:  Head: Normocephalic and atraumatic.  Eyes: EOM are normal.  Neck: Normal range of motion.  Cardiovascular: Normal rate, regular rhythm and normal heart sounds.   Respiratory: Effort normal and breath sounds normal. No respiratory distress.  GI: Soft. Bowel sounds are normal. She exhibits no distension. There is no tenderness.  Genitourinary:  Vagina with white discharge.  Once this is cleaned away, pt asked to valsalva forcefully.  No pooling noted.   Also no bleeding.  Cervix is 1.5cm dilated, thick.    Musculoskeletal: Normal range of motion.  Neurological: She is alert and oriented to person, place, and time.  Skin: Skin is warm and dry.  Psychiatric: She has a normal mood and affect.   Results for orders placed or performed during the hospital encounter of 12/30/13 (from the past 24 hour(s))  Urinalysis, Routine w reflex microscopic     Status: Abnormal   Collection Time: 12/30/13 11:30 AM  Result Value Ref Range   Color, Urine YELLOW YELLOW   APPearance CLEAR CLEAR   Specific Gravity, Urine 1.010 1.005 - 1.030   pH 6.0 5.0 - 8.0   Glucose, UA NEGATIVE NEGATIVE mg/dL   Hgb urine dipstick TRACE (A) NEGATIVE   Bilirubin Urine NEGATIVE NEGATIVE  Ketones, ur 15 (A) NEGATIVE mg/dL   Protein, ur NEGATIVE NEGATIVE mg/dL   Urobilinogen, UA 0.2 0.0 - 1.0 mg/dL   Nitrite NEGATIVE NEGATIVE   Leukocytes, UA NEGATIVE NEGATIVE  Urine microscopic-add on     Status: None   Collection Time: 12/30/13 11:30 AM  Result Value Ref Range   Squamous Epithelial / LPF RARE RARE   RBC / HPF 0-2 <3 RBC/hpf  Amnisure rupture of membrane (rom)     Status: None   Collection Time: 12/30/13 12:45 PM  Result Value Ref Range   Amnisure ROM NEGATIVE   Fetal fibronectin     Status: None   Collection Time: 12/30/13 12:45 PM  Result Value Ref Range   Fetal Fibronectin NEGATIVE NEGATIVE   Fetal Tracing:  Baseline:120s Variability:mod Accelerations: 15x15 Decelerations:no  decels - few variables noted late in tracing  Toco: irritable contraction pattern q 1-2 min   MAU Course  Procedures  MDM IV fluids ordered.  FFN collected, Amnisure collected and sent.  Inspection for pooling - negative.  Cervix 1.5cm and thick.  Consulted with Dr. Tenny Crawoss with report.  Fetal tracing reviewed.  She advises to wait for amnisure, send FFN, give Procardia 20mg  x 1, hydrate with IV fluids.   Amnisure negative.  FFN negative.  No cervical change after one hour.  Pt indicating now that she is having a dry cough and hard time taking deep breath.  Clear to ausculation throughout again at this time.  Pulse ox 99%.  Report called again to Dr. Fredia SorrowK Ross.  Tracing reviewed as well as lab results.  MD in agreement to discharge pt and provide Procardia 10mg  po q 4 hours prn.     Assessment and Plan  A:  1. Threatened preterm labor, third trimester    P: Discharge to home Reassurance provided Procardia 10mg  po q 4 hours PRN. Hydrate well Keep clinic appt in 1 week.   Patient may return to MAU as needed or if her condition were to change or worsen  Continue pelvic rest  Bertram Denvereague Clark, Karen E 12/30/2013, 12:25 PM

## 2014-01-13 ENCOUNTER — Inpatient Hospital Stay (HOSPITAL_COMMUNITY)
Admission: AD | Admit: 2014-01-13 | Discharge: 2014-01-14 | Disposition: A | Discharge status: Home / Self Care | Payer: 59 | Source: Ambulatory Visit | Attending: Obstetrics and Gynecology | Admitting: Obstetrics and Gynecology

## 2014-01-13 ENCOUNTER — Encounter (HOSPITAL_COMMUNITY): Payer: Self-pay | Admitting: *Deleted

## 2014-01-13 NOTE — MAU Provider Note (Signed)
S: Alison Moss is a 23 y.o. G1P0 at 5775w3d who presents today with leaking of fluid. She denies any VB. She confirms fetal movement. O: VSS, afebrile Abdomen: soft, non-tender, gravid External: no lesion Vagina: large amount of thick mucus and white discharge.  This was removed with a fox swab and pt was asked to valsalva/cough repeatedly.  No LOF/no pooling noted whatsoever.  Cervix: dilated 2- 3cm, 50%, no CMT Uterus: AGA A/P: Exam for ROM - unlikley ruptured RN will report to attending MD

## 2014-01-13 NOTE — Discharge Instructions (Signed)
Preterm Labor Information Preterm labor is when labor starts at less than 37 weeks of pregnancy. The normal length of a pregnancy is 39 to 41 weeks. CAUSES Often, there is no identifiable underlying cause as to why a woman goes into preterm labor. One of the most common known causes of preterm labor is infection. Infections of the uterus, cervix, vagina, amniotic sac, bladder, kidney, or even the lungs (pneumonia) can cause labor to start. Other suspected causes of preterm labor include:   Urogenital infections, such as yeast infections and bacterial vaginosis.   Uterine abnormalities (uterine shape, uterine septum, fibroids, or bleeding from the placenta).   A cervix that has been operated on (it may fail to stay closed).   Malformations in the fetus.   Multiple gestations (twins, triplets, and so on).   Breakage of the amniotic sac.  RISK FACTORS  Having a previous history of preterm labor.   Having premature rupture of membranes (PROM).   Having a placenta that covers the opening of the cervix (placenta previa).   Having a placenta that separates from the uterus (placental abruption).   Having a cervix that is too weak to hold the fetus in the uterus (incompetent cervix).   Having too much fluid in the amniotic sac (polyhydramnios).   Taking illegal drugs or smoking while pregnant.   Not gaining enough weight while pregnant.   Being younger than 3118 and older than 23 years old.   Having a low socioeconomic status.   Being African American. SYMPTOMS Signs and symptoms of preterm labor include:   Menstrual-like cramps, abdominal pain, or back pain.  Uterine contractions that are regular, as frequent as six in an hour, regardless of their intensity (may be mild or painful).  Contractions that start on the top of the uterus and spread down to the lower abdomen and back.   A sense of increased pelvic pressure.   A watery or bloody mucus discharge that  comes from the vagina.  TREATMENT Depending on the length of the pregnancy and other circumstances, your health care provider may suggest bed rest. If necessary, there are medicines that can be given to stop contractions and to mature the fetal lungs. If labor happens before 34 weeks of pregnancy, a prolonged hospital stay may be recommended. Treatment depends on the condition of both you and the fetus.  WHAT SHOULD YOU DO IF YOU THINK YOU ARE IN PRETERM LABOR? Call your health care provider right away. You will need to go to the hospital to get checked immediately. HOW CAN YOU PREVENT PRETERM LABOR IN FUTURE PREGNANCIES? You should:   Stop smoking if you smoke.  Maintain healthy weight gain and avoid chemicals and drugs that are not necessary.  Be watchful for any type of infection.  Inform your health care provider if you have a known history of preterm labor.        KEEP APPOINTMENT ALREADY SCHEDULED        RETURN TO MAU FOR  SROM VAGINAL BLEEDING  (SPOTTING NORMAL AFTER VAGINAL EXAM) BLEEDING LIKE A PERIOD IS TOO MUCH DECREASED FETAL MOVEMENT INCREASED PAIN (INTENSITY) Document Released: 03/23/2003 Document Revised: 09/02/2012 Document Reviewed: 02/03/2012 [facility] Patient Information 2015 [employer]. This information is not intended to replace advice given to you by your health care provider. Make sure you discuss any questions you have with your health care provider.

## 2014-01-13 NOTE — MAU Note (Signed)
Pt states she started having contractions about 2000 tonight the patient states she has been having contractions for the past week anytime she does anything

## 2014-01-14 HISTORY — PX: WISDOM TOOTH EXTRACTION: SHX21

## 2014-01-14 NOTE — L&D Delivery Note (Signed)
Delivery Note After pushing well for 2 hours, at 6:10 PM a viable female was delivered via Vaginal, Spontaneous Delivery (Presentation: Left Occiput Anterior).  APGAR: 9, 9; weight  pending.   Placenta status: Intact, Spontaneous.  Cord: 3 vessels with the following complications: None.  Cord pH: n/a  Immediately following delivery, the LUS was boggy with clot.  A uterine sweep was performed with excellent tone to follow.  Uterine bleeding was then minimal.   Anesthesia: Epidural  Episiotomy: None Lacerations: Small left vaginal sidewall laceration repaired with a figure of eight stitch Suture Repair: 3.0 vicryl rapide Est. Blood Loss (mL): 500  Mom to postpartum.  Baby to Couplet care / Skin to Skin.  Marlow BaarsCLARK, [name] 02/07/2014, 6:33 PM

## 2014-01-20 ENCOUNTER — Encounter (HOSPITAL_COMMUNITY): Payer: Self-pay | Admitting: *Deleted

## 2014-01-20 ENCOUNTER — Inpatient Hospital Stay (HOSPITAL_COMMUNITY)
Admission: AD | Admit: 2014-01-20 | Discharge: 2014-01-20 | Disposition: A | Discharge status: Home / Self Care | Payer: 59 | Source: Ambulatory Visit | Attending: Obstetrics and Gynecology | Admitting: Obstetrics and Gynecology

## 2014-01-20 DIAGNOSIS — Z3A36 36 weeks gestation of pregnancy: Secondary | ICD-10-CM | POA: Diagnosis not present

## 2014-01-20 HISTORY — DX: Solitary cyst of unspecified breast: N60.09

## 2014-01-20 NOTE — MAU Note (Signed)
Pt states that contractions began regularly about every 2 minutes at 3pm today. Denies SROM/LOF, denies vag bleeding. States positive fetal movement but less active. Denies any r/f's except PTL at 33 weeks. Denies infections.

## 2014-02-07 ENCOUNTER — Inpatient Hospital Stay (HOSPITAL_COMMUNITY): Payer: 59 | Admitting: Anesthesiology

## 2014-02-07 ENCOUNTER — Encounter (HOSPITAL_COMMUNITY): Payer: Self-pay

## 2014-02-07 ENCOUNTER — Inpatient Hospital Stay (HOSPITAL_COMMUNITY)
Admission: AD | Admit: 2014-02-07 | Discharge: 2014-02-09 | DRG: 775 | Disposition: A | Discharge status: Home / Self Care | Payer: 59 | Source: Ambulatory Visit | Attending: Obstetrics | Admitting: Obstetrics

## 2014-02-07 DIAGNOSIS — Z3A39 39 weeks gestation of pregnancy: Secondary | ICD-10-CM | POA: Diagnosis present

## 2014-02-07 DIAGNOSIS — IMO0001 Reserved for inherently not codable concepts without codable children: Secondary | ICD-10-CM

## 2014-02-07 DIAGNOSIS — O99824 Streptococcus B carrier state complicating childbirth: Secondary | ICD-10-CM | POA: Diagnosis present

## 2014-02-07 DIAGNOSIS — Z91013 Allergy to seafood: Secondary | ICD-10-CM

## 2014-02-07 LAB — OB RESULTS CONSOLE GBS: STREP GROUP B AG: POSITIVE

## 2014-02-07 LAB — CBC
HCT: 35.9 % — ABNORMAL LOW (ref 36.0–46.0)
Hemoglobin: 12.3 g/dL (ref 12.0–15.0)
MCH: 31.8 pg (ref 26.0–34.0)
MCHC: 34.3 g/dL (ref 30.0–36.0)
MCV: 92.8 fL (ref 78.0–100.0)
PLATELETS: 178 10*3/uL (ref 150–400)
RBC: 3.87 MIL/uL (ref 3.87–5.11)
RDW: 13.2 % (ref 11.5–15.5)
WBC: 8.6 10*3/uL (ref 4.0–10.5)

## 2014-02-07 MED ORDER — BENZOCAINE-MENTHOL 20-0.5 % EX AERO
1.0000 "application " | INHALATION_SPRAY | CUTANEOUS | Status: DC | PRN
  Filled 2014-02-07: qty 56

## 2014-02-07 MED ORDER — ONDANSETRON HCL 4 MG/2ML IJ SOLN: 4.0000 mg | Freq: Four times a day (QID) | INTRAMUSCULAR | Status: DC | PRN

## 2014-02-07 MED ORDER — EPHEDRINE 5 MG/ML INJ
10.0000 mg | INTRAVENOUS | Status: DC | PRN
  Filled 2014-02-07: qty 2

## 2014-02-07 MED ORDER — OXYCODONE-ACETAMINOPHEN 5-325 MG PO TABS: 2.0000 | ORAL_TABLET | ORAL | Status: DC | PRN

## 2014-02-07 MED ORDER — PHENYLEPHRINE 40 MCG/ML (10ML) SYRINGE FOR IV PUSH (FOR BLOOD PRESSURE SUPPORT)
80.0000 ug | PREFILLED_SYRINGE | INTRAVENOUS | Status: DC | PRN
  Filled 2014-02-07: qty 20
  Filled 2014-02-07: qty 2

## 2014-02-07 MED ORDER — PENICILLIN G POTASSIUM 5000000 UNITS IJ SOLR
5.0000 10*6.[IU] | Freq: Once | INTRAVENOUS | Status: AC
  Administered 2014-02-07: 5 10*6.[IU] via INTRAVENOUS
  Filled 2014-02-07: qty 5

## 2014-02-07 MED ORDER — BUTORPHANOL TARTRATE 1 MG/ML IJ SOLN: 1.0000 mg | INTRAMUSCULAR | Status: DC | PRN

## 2014-02-07 MED ORDER — IBUPROFEN 600 MG PO TABS
600.0000 mg | ORAL_TABLET | Freq: Four times a day (QID) | ORAL | Status: DC
  Administered 2014-02-08 (×3): 600 mg via ORAL
  Filled 2014-02-07 (×5): qty 1

## 2014-02-07 MED ORDER — ONDANSETRON HCL 4 MG/2ML IJ SOLN: 4.0000 mg | INTRAMUSCULAR | Status: DC | PRN

## 2014-02-07 MED ORDER — LANOLIN HYDROUS EX OINT: TOPICAL_OINTMENT | CUTANEOUS | Status: DC | PRN

## 2014-02-07 MED ORDER — LACTATED RINGERS IV SOLN
500.0000 mL | Freq: Once | INTRAVENOUS | Status: AC
  Administered 2014-02-07: 500 mL via INTRAVENOUS

## 2014-02-07 MED ORDER — LIDOCAINE HCL (PF) 1 % IJ SOLN
30.0000 mL | INTRAMUSCULAR | Status: DC | PRN
  Filled 2014-02-07: qty 30

## 2014-02-07 MED ORDER — TETANUS-DIPHTH-ACELL PERTUSSIS 5-2.5-18.5 LF-MCG/0.5 IM SUSP
0.5000 mL | Freq: Once | INTRAMUSCULAR | Status: AC
  Administered 2014-02-09: 0.5 mL via INTRAMUSCULAR
  Filled 2014-02-07: qty 0.5

## 2014-02-07 MED ORDER — PHENYLEPHRINE 40 MCG/ML (10ML) SYRINGE FOR IV PUSH (FOR BLOOD PRESSURE SUPPORT)
80.0000 ug | PREFILLED_SYRINGE | INTRAVENOUS | Status: DC | PRN
  Filled 2014-02-07: qty 2

## 2014-02-07 MED ORDER — FENTANYL 2.5 MCG/ML BUPIVACAINE 1/10 % EPIDURAL INFUSION (WH - ANES)
INTRAMUSCULAR | Status: DC | PRN
  Administered 2014-02-07: 14 mL/h via EPIDURAL

## 2014-02-07 MED ORDER — OXYTOCIN 40 UNITS IN LACTATED RINGERS INFUSION - SIMPLE MED
62.5000 mL/h | INTRAVENOUS | Status: DC
  Administered 2014-02-07: 62.5 mL/h via INTRAVENOUS
  Filled 2014-02-07: qty 1000

## 2014-02-07 MED ORDER — ACETAMINOPHEN 325 MG PO TABS: 650.0000 mg | ORAL_TABLET | ORAL | Status: DC | PRN

## 2014-02-07 MED ORDER — DIPHENHYDRAMINE HCL 50 MG/ML IJ SOLN: 12.5000 mg | INTRAMUSCULAR | Status: DC | PRN

## 2014-02-07 MED ORDER — OXYTOCIN 40 UNITS IN LACTATED RINGERS INFUSION - SIMPLE MED
1.0000 m[IU]/min | INTRAVENOUS | Status: DC
  Administered 2014-02-07: 2 m[IU]/min via INTRAVENOUS
  Administered 2014-02-07: 6 m[IU]/min via INTRAVENOUS

## 2014-02-07 MED ORDER — PRENATAL MULTIVITAMIN CH
1.0000 | ORAL_TABLET | Freq: Every day | ORAL | Status: DC
Start: 2014-02-08 — End: 2014-02-09
  Administered 2014-02-08: 1 via ORAL
  Filled 2014-02-07: qty 1

## 2014-02-07 MED ORDER — FENTANYL 2.5 MCG/ML BUPIVACAINE 1/10 % EPIDURAL INFUSION (WH - ANES)
14.0000 mL/h | INTRAMUSCULAR | Status: DC | PRN
  Administered 2014-02-07: 14 mL/h via EPIDURAL
  Filled 2014-02-07: qty 125

## 2014-02-07 MED ORDER — LIDOCAINE HCL (PF) 1 % IJ SOLN
INTRAMUSCULAR | Status: DC | PRN
  Administered 2014-02-07 (×2): 8 mL

## 2014-02-07 MED ORDER — PENICILLIN G POTASSIUM 5000000 UNITS IJ SOLR
2.5000 10*6.[IU] | INTRAVENOUS | Status: DC
  Administered 2014-02-07 (×3): 2.5 10*6.[IU] via INTRAVENOUS
  Filled 2014-02-07 (×6): qty 2.5

## 2014-02-07 MED ORDER — TERBUTALINE SULFATE 1 MG/ML IJ SOLN
0.2500 mg | Freq: Once | INTRAMUSCULAR | Status: DC | PRN
  Filled 2014-02-07: qty 1

## 2014-02-07 MED ORDER — OXYCODONE-ACETAMINOPHEN 5-325 MG PO TABS: 1.0000 | ORAL_TABLET | ORAL | Status: DC | PRN

## 2014-02-07 MED ORDER — WITCH HAZEL-GLYCERIN EX PADS: 1.0000 "application " | MEDICATED_PAD | CUTANEOUS | Status: DC | PRN

## 2014-02-07 MED ORDER — LACTATED RINGERS IV SOLN
500.0000 mL | INTRAVENOUS | Status: DC | PRN
  Administered 2014-02-07: 500 mL via INTRAVENOUS

## 2014-02-07 MED ORDER — OXYTOCIN BOLUS FROM INFUSION
500.0000 mL | INTRAVENOUS | Status: DC
  Administered 2014-02-07: 500 mL via INTRAVENOUS

## 2014-02-07 MED ORDER — CITRIC ACID-SODIUM CITRATE 334-500 MG/5ML PO SOLN: 30.0000 mL | ORAL | Status: DC | PRN

## 2014-02-07 MED ORDER — LACTATED RINGERS IV SOLN
INTRAVENOUS | Status: DC
  Administered 2014-02-07 (×2): via INTRAVENOUS

## 2014-02-07 MED ORDER — DIPHENHYDRAMINE HCL 25 MG PO CAPS: 25.0000 mg | ORAL_CAPSULE | Freq: Four times a day (QID) | ORAL | Status: DC | PRN

## 2014-02-07 MED ORDER — SENNOSIDES-DOCUSATE SODIUM 8.6-50 MG PO TABS
2.0000 | ORAL_TABLET | ORAL | Status: DC
Start: 2014-02-08 — End: 2014-02-09
  Administered 2014-02-08: 2 via ORAL
  Filled 2014-02-07 (×2): qty 2

## 2014-02-07 MED ORDER — DIBUCAINE 1 % RE OINT: 1.0000 "application " | TOPICAL_OINTMENT | RECTAL | Status: DC | PRN

## 2014-02-07 MED ORDER — SIMETHICONE 80 MG PO CHEW: 80.0000 mg | CHEWABLE_TABLET | ORAL | Status: DC | PRN

## 2014-02-07 MED ORDER — ONDANSETRON HCL 4 MG PO TABS: 4.0000 mg | ORAL_TABLET | ORAL | Status: DC | PRN

## 2014-02-07 NOTE — MAU Note (Signed)
Pt reports spotting and contractions this pm

## 2014-02-07 NOTE — H&P (Signed)
24 y.o. G1P0 @ 6740w0d presented overnight with regular painful contractions.  Otherwise has good fetal movement and no bleeding.  Past Medical History  Diagnosis Date  . Asthma   . Anemia   . Breast cyst     Past Surgical History  Procedure Laterality Date  . No past surgeries    . Breast biopsy Left     OB History  Gravida Para Term Preterm AB SAB TAB Ectopic Multiple Living  1             # Outcome Date GA Lbr Len/2nd Weight Sex Delivery Anes PTL Lv  1 Current               History   Social History  . Marital Status: Married    Spouse Name: N/A    Number of Children: N/A  . Years of Education: N/A   Occupational History  . Not on file.   Social History Main Topics  . Smoking status: Never Smoker   . Smokeless tobacco: Never Used  . Alcohol Use: No  . Drug Use: No  . Sexual Activity: Yes    Birth Control/ Protection: None   Other Topics Concern  . Not on file   Social History Narrative   Shellfish allergy    Prenatal Transfer Tool  Maternal Diabetes: No Failed 1hr, passed 3hr Genetic Screening: Normal FTS Maternal Ultrasounds/Referrals: Normal Fetal Ultrasounds or other Referrals:  None Maternal Substance Abuse:  No Significant Maternal Medications:  None Significant Maternal Lab Results: Lab values include: Group B Strep positive, Rh negative  ABO, Rh: --/--/O NEG (01/25 0050) Antibody: POS (01/25 0050) Rubella:   Immune RPR: Nonreactive (07/13 0000)  HBsAg: Negative (07/13 0000)  HIV: Non-reactive (07/13 0000)  GBS: Positive (01/25 0000)    Other PNC: uncomplicated.    Filed Vitals:   02/07/14 0806  BP:   Pulse:   Temp: 98.2 F (36.8 C)  Resp:      General:  NAD Abdomen:  soft, gravid, EFW 7-7.5# Ex:  trace edema SVE:  6/70/-2, AROM clear fluid FHTs:  120s mod var, + accels, Cat 1 Toco:  q3-5 min   A/P   24 y.o. 7640w0d  G1P0 presents with labor Prefers natural labor without augmentation Prefers no epidural/IV meds. FSR/ vtx/  GBS positive   CleghornLARK, J[facility] HospitalDYANNA

## 2014-02-07 NOTE — Anesthesia Procedure Notes (Signed)
Epidural Patient location during procedure: OB Start time: 02/07/2014 12:53 PM End time: 02/07/2014 12:57 PM  Staffing Anesthesiologist: Leilani AbleHATCHETT, Makale Pindell Performed by: anesthesiologist   Preanesthetic Checklist Completed: patient identified, surgical consent, pre-op evaluation, timeout performed, IV checked, risks and benefits discussed and monitors and equipment checked  Epidural Patient position: sitting Prep: site prepped and draped and DuraPrep Patient monitoring: continuous pulse ox and blood pressure Approach: midline Injection technique: LOR air  Needle:  Needle type: Tuohy  Needle gauge: 17 G Needle length: 9 cm and 9 Needle insertion depth: 5 cm cm Catheter type: closed end flexible Catheter size: 19 Gauge Catheter at skin depth: 10 cm Test dose: negative and Other  Assessment Sensory level: T9 Events: blood not aspirated, injection not painful, no injection resistance, negative IV test and no paresthesia

## 2014-02-07 NOTE — Progress Notes (Addendum)
Patient's blood pressures at Admission to M/B and 1-hour check was 93/50  ([id number](RR110) and 93/52 (UE45(RR95).  Patient asymptomatic, has been up and voided.  Uterus is at U/1 and firm; bleeding small.  Patient has had dinner and is drinking.  Attempted to notify [facility] (Dr Chestine Sporelark, [phone]); message said not receiving calls.

## 2014-02-07 NOTE — Anesthesia Preprocedure Evaluation (Signed)
Anesthesia Evaluation  Patient identified by MRN, date of birth, ID band Patient awake    Reviewed: Allergy & Precautions, H&P , NPO status , Patient's Chart, lab work & pertinent test results  Airway Mallampati: I  TM Distance: >3 FB Neck ROM: full    Dental no notable dental hx.    Pulmonary    Pulmonary exam normal       Cardiovascular negative cardio ROS      Neuro/Psych negative neurological ROS  negative psych ROS   GI/Hepatic negative GI ROS, Neg liver ROS,   Endo/Other  negative endocrine ROS  Renal/GU negative Renal ROS     Musculoskeletal   Abdominal Normal abdominal exam  (+)   Peds  Hematology   Anesthesia Other Findings   Reproductive/Obstetrics (+) Pregnancy                             Anesthesia Physical Anesthesia Plan  ASA: II  Anesthesia Plan: Epidural   Post-op Pain Management:    Induction:   Airway Management Planned:   Additional Equipment:   Intra-op Plan:   Post-operative Plan:   Informed Consent: I have reviewed the patients History and Physical, chart, labs and discussed the procedure including the risks, benefits and alternatives for the proposed anesthesia with the patient or authorized representative who has indicated his/her understanding and acceptance.     Plan Discussed with:   Anesthesia Plan Comments:         Anesthesia Quick Evaluation

## 2014-02-07 NOTE — MAU Note (Signed)
Contractions x 2 hours. Some bloody show tonight. Denies LOF. Positive fetal movement.

## 2014-02-07 NOTE — Progress Notes (Signed)
Painful with contractions.  Has been walking, hands & knees, frequent position changes.  Bad back pain between ctx  EFM: 130-140s, mod var, + accels, Cat 1 Toco: q4-5 min SVE: 6/80/-1, ?OP  Prolonged active phase Suspect OP presentation Will start pitocin now 2x2 Pt now considering epidural.  FSR/vtx/7-7.5#

## 2014-02-08 LAB — CBC
HEMATOCRIT: 27.7 % — AB (ref 36.0–46.0)
Hemoglobin: 9.7 g/dL — ABNORMAL LOW (ref 12.0–15.0)
MCH: 32.4 pg (ref 26.0–34.0)
MCHC: 35 g/dL (ref 30.0–36.0)
MCV: 92.6 fL (ref 78.0–100.0)
Platelets: 130 10*3/uL — ABNORMAL LOW (ref 150–400)
RBC: 2.99 MIL/uL — AB (ref 3.87–5.11)
RDW: 13.5 % (ref 11.5–15.5)
WBC: 15.8 10*3/uL — AB (ref 4.0–10.5)

## 2014-02-08 LAB — RPR: RPR Ser Ql: NONREACTIVE

## 2014-02-08 NOTE — Anesthesia Postprocedure Evaluation (Signed)
Anesthesia Post Note  Patient: Alison Moss  Procedure(s) Performed: * No procedures listed *  Anesthesia type: Epidural  Patient location: Mother/Baby  Post pain: Pain level controlled  Post assessment: Post-op Vital signs reviewed  Last Vitals:  Filed Vitals:   02/08/14 0500  BP: 84/55  Pulse: 74  Temp: 36.4 C  Resp: 18    Post vital signs: Reviewed  Level of consciousness:alert  Complications: No apparent anesthesia complications

## 2014-02-08 NOTE — Discharge Summary (Signed)
Obstetric Discharge Summary Reason for Admission: onset of labor Prenatal Procedures: NST Intrapartum Procedures: spontaneous vaginal delivery Postpartum Procedures: none Complications-Operative and Postpartum: none except small vaginal tear HEMOGLOBIN  Date Value Ref Range Status  02/08/2014 9.7* 12.0 - 15.0 g/dL Final    Comment:    DELTA CHECK NOTED REPEATED TO VERIFY    HCT  Date Value Ref Range Status  02/08/2014 27.7* 36.0 - 46.0 % Final    Discharge Diagnoses: Term Pregnancy-delivered  Discharge Information: Date: 02/08/2014 Activity: pelvic rest Diet: routine Medications: Ibuprofen and Iron Condition: stable Instructions: refer to practice specific booklet Discharge to: home Follow-up Information    Follow up with Marlow BaarsLARK, DYANNA, MD In 4 weeks.   Specialty:  Obstetrics   Contact information:   [address] [place] [address] [phone]       Newborn Data: Live born female  Birth Weight: 7 lb 15.3 oz (3609 g) APGAR: 9, 9  Home with mother.  Harnoor Kohles A 02/08/2014, 8:21 AM

## 2014-02-08 NOTE — Lactation Note (Signed)
This note was copied from the chart of Alison Moss. Lactation Consultation Note  P1, Baby has had difficulty latching and has been sleepy since circ this am. Mother is using #16NS. Mother placed him in cross cradle hold.  Sucks and swallows observed. Reviewed cluster feeding, foley cup use, postpumping and spoon feeding. Suggest mother post pump 4-6 times a day for 15-20 min and give baby back volume pumped. Also encouraged mother to try latching baby without NS since she has evert nipples. Discussed engorgement care and monitoring voids/stools. Provided mother with another #16NS & #20NS and comfort gels.  Patient Name: Alison Moss [id number]'[id number]'s Date: 02/08/2014 Reason for consult: Initial assessment   Maternal Data Has patient been taught Hand Expression?: Yes Does the patient have breastfeeding experience prior to this delivery?: No  Feeding Feeding Type: Breast Fed Length of feed: 10 min  LATCH Score/Interventions Latch: Grasps breast easily, tongue down, lips flanged, rhythmical sucking. (latched upon entering) Intervention(s): Breast massage  Audible Swallowing: A few with stimulation Intervention(s): Skin to skin  Type of Nipple: Everted at rest and after stimulation  Comfort (Breast/Nipple): Soft / non-tender     Hold (Positioning): Assistance needed to correctly position infant at breast and maintain latch.  LATCH Score: 8  Lactation Tools Discussed/Used Tools: Nipple Shields Nipple shield size: 16   Consult Status Consult Status: Follow-up Date: 02/09/14 Follow-up type: In-patient    Dahlia ByesBerkelhammer, Shashana Fullington [facility]os[facility] 02/08/2014, 2:55 PM

## 2014-02-08 NOTE — Progress Notes (Signed)
Baby ready for circ-  Parents desires circumsision.  All risks, benefits and alternatives discussed with the mother.

## 2014-02-08 NOTE — Progress Notes (Signed)
Patient is eating, ambulating, voiding.  Pain control is good.  Filed Vitals:   02/07/14 2049 02/07/14 2200 02/08/14 0215 02/08/14 0500  BP: 93/50 93/52 91/56  84/55  Pulse: 110 95 75 74  Temp: 98 F (36.7 C) 98.2 F (36.8 C) 98 F (36.7 C) 97.6 F (36.4 C)  TempSrc: Axillary Axillary Axillary Axillary  Resp: 20 18 18 18   SpO2: 99% 98% 97% 99%    Fundus firm Perineum without swelling.  Lab Results  Component Value Date   WBC 15.8* 02/08/2014   HGB 9.7* 02/08/2014   HCT 27.7* 02/08/2014   MCV 92.6 02/08/2014   PLT 130* 02/08/2014    --/--/O NEG (01/25 0050)/RI  A/P Post partum day 1.  Routine care.  Expect d/c routine.  Baby has not voided and MD not seen yet- circ to be done later.   Baby A NEG- no need for Rhogam.   Haisley Arens A

## 2014-02-09 NOTE — Lactation Note (Signed)
This note was copied from the chart of Alison Melvenia NeedlesMollie Nauta. Lactation Consultation Note  Baby latched in cross cradle upon entering the room with #20NS.  Some sucks and swallows observed.  According to Bethann Berkshirerisha RN, mother had some bleeding on left nipple and NS seems to help w/ soreness. Baby at end of feeding and came off.  Very difficult to view colostrum in NS. Encouraged mother to post pump when she gets home to help establish her milk supply.  Reviewed supply and demand. Provided family w/ curved tip syringe to prefill NS to help w/ latch and another #20NS. Mother has comfort gels.  Encouraged her to wear comfort gels. Reviewed engorgement care and monitoring voids/stools.  Patient Name: Alison Moss ZOXWR'UToday's Date: 02/09/2014 Reason for consult: Follow-up assessment   Maternal Data    Feeding Feeding Type: Breast Fed Length of feed: 20 min  LATCH Score/Interventions Latch: Grasps breast easily, tongue down, lips flanged, rhythmical sucking. Intervention(s): Adjust position;Assist with latch  Audible Swallowing: A few with stimulation Intervention(s): Skin to skin  Type of Nipple: Everted at rest and after stimulation  Comfort (Breast/Nipple): Soft / non-tender  Problem noted: Mild/Moderate discomfort Interventions (Mild/moderate discomfort): Breast shields;Pre-pump if needed;Hand expression;Hand massage  Hold (Positioning): Assistance needed to correctly position infant at breast and maintain latch. Intervention(s): Skin to skin;Position options;Support Pillows  LATCH Score: 8  Lactation Tools Discussed/Used Tools: Nipple Shields   Consult Status Consult Status: Complete    Hardie PulleyBerkelhammer, Deangleo Passage Boschen 02/09/2014, 8:53 AM

## 2014-02-09 NOTE — Discharge Summary (Signed)
Obstetric Discharge Summary Reason for Admission: onset of labor Prenatal Procedures: ultrasound Intrapartum Procedures: spontaneous vaginal delivery Postpartum Procedures: none Complications-Operative and Postpartum: left vaginal wall tear HEMOGLOBIN  Date Value Ref Range Status  02/08/2014 9.7* 12.0 - 15.0 g/dL Final    Comment:    DELTA CHECK NOTED REPEATED TO VERIFY    HCT  Date Value Ref Range Status  02/08/2014 27.7* 36.0 - 46.0 % Final    Physical Exam:  General: alert and cooperative Lochia: appropriate Uterine Fundus: firm DVT Evaluation: No evidence of DVT seen on physical exam.  Discharge Diagnoses: Term Pregnancy-delivered  Discharge Information: Date: 02/09/2014 Activity: pelvic rest Diet: routine Medications: PNV and Ibuprofen Condition: stable Instructions: refer to practice specific booklet Discharge to: home Follow-up Information    Follow up with Marlow BaarsLARK, DYANNA, MD In 4 weeks.   Specialty:  Obstetrics   Contact information:   [address] [place] [address] [phone]       Follow up with Marlow BaarsLARK, DYANNA, MD In 4 weeks.   Specialty:  Obstetrics   Contact information:   [address] [place] [id number] [phone]       Newborn Data: Live born female  Birth Weight: 7 lb 15.3 oz (3609 g) APGAR: 9, 9  Home with mother.  Alison Moss, Alison Moss 02/09/2014, 10:06 AM

## 2014-02-11 LAB — TYPE AND SCREEN
ABO/RH(D): O NEG
Antibody Screen: POSITIVE
DAT, IgG: NEGATIVE
UNIT DIVISION: 0
Unit division: 0

## 2015-11-07 ENCOUNTER — Telehealth: Payer: Self-pay | Admitting: *Deleted

## 2015-11-07 NOTE — Telephone Encounter (Signed)
Unable to reach patient at time of Pre-Visit Call.  Left message for patient to return call when available.    

## 2015-11-08 ENCOUNTER — Encounter: Payer: Self-pay | Admitting: Family Medicine

## 2015-11-08 ENCOUNTER — Ambulatory Visit (INDEPENDENT_AMBULATORY_CARE_PROVIDER_SITE_OTHER): Payer: 59 | Admitting: Family Medicine

## 2015-11-08 VITALS — BP 111/76 | HR 82 | Temp 98.0°F | Ht 61.0 in | Wt 117.0 lb

## 2015-11-08 DIAGNOSIS — Z789 Other specified health status: Secondary | ICD-10-CM | POA: Diagnosis not present

## 2015-11-08 DIAGNOSIS — Z13220 Encounter for screening for lipoid disorders: Secondary | ICD-10-CM | POA: Diagnosis not present

## 2015-11-08 DIAGNOSIS — Z1329 Encounter for screening for other suspected endocrine disorder: Secondary | ICD-10-CM

## 2015-11-08 DIAGNOSIS — Z1321 Encounter for screening for nutritional disorder: Secondary | ICD-10-CM | POA: Diagnosis not present

## 2015-11-08 DIAGNOSIS — E559 Vitamin D deficiency, unspecified: Secondary | ICD-10-CM

## 2015-11-08 DIAGNOSIS — N943 Premenstrual tension syndrome: Secondary | ICD-10-CM

## 2015-11-08 DIAGNOSIS — Z130 Encounter for screening for diseases of the blood and blood-forming organs and certain disorders involving the immune mechanism: Secondary | ICD-10-CM

## 2015-11-08 LAB — CBC
HEMATOCRIT: 37.3 % (ref 36.0–46.0)
Hemoglobin: 12.5 g/dL (ref 12.0–15.0)
MCHC: 33.5 g/dL (ref 30.0–36.0)
MCV: 90.2 fl (ref 78.0–100.0)
Platelets: 302 10*3/uL (ref 150.0–400.0)
RBC: 4.14 Mil/uL (ref 3.87–5.11)
RDW: 13.1 % (ref 11.5–15.5)
WBC: 6.7 10*3/uL (ref 4.0–10.5)

## 2015-11-08 LAB — COMPREHENSIVE METABOLIC PANEL
ALT: 8 U/L (ref 0–35)
AST: 11 U/L (ref 0–37)
Albumin: 4.5 g/dL (ref 3.5–5.2)
Alkaline Phosphatase: 87 U/L (ref 39–117)
BUN: 5 mg/dL — AB (ref 6–23)
CHLORIDE: 104 meq/L (ref 96–112)
CO2: 28 mEq/L (ref 19–32)
Calcium: 9.9 mg/dL (ref 8.4–10.5)
Creatinine, Ser: 0.57 mg/dL (ref 0.40–1.20)
GFR: 136.71 mL/min (ref >60.00)
GLUCOSE: 78 mg/dL (ref 70–99)
POTASSIUM: 4 meq/L (ref 3.5–5.1)
SODIUM: 139 meq/L (ref 135–145)
Total Bilirubin: 0.6 mg/dL (ref 0.2–1.2)
Total Protein: 7.8 g/dL (ref 6.0–8.3)

## 2015-11-08 LAB — TSH: TSH: 1.9 u[IU]/mL (ref 0.35–4.50)

## 2015-11-08 LAB — VITAMIN D 25 HYDROXY (VIT D DEFICIENCY, FRACTURES): VITD: 15.42 ng/mL — ABNORMAL LOW (ref 30.00–100.00)

## 2015-11-08 LAB — LIPID PANEL
CHOLESTEROL: 127 mg/dL (ref 0–200)
HDL: 45.7 mg/dL (ref >39.00)
LDL Cholesterol: 68 mg/dL (ref 0–99)
NONHDL: 80.85
Total CHOL/HDL Ratio: 3
Triglycerides: 63 mg/dL (ref 0.0–149.0)
VLDL: 12.6 mg/dL (ref 0.0–40.0)

## 2015-11-08 LAB — FOLATE: Folate: >23.4 ng/mL (ref >5.9)

## 2015-11-08 LAB — VITAMIN B12: Vitamin B-12: >1500 pg/mL — ABNORMAL HIGH (ref 211–911)

## 2015-11-08 MED ORDER — VITAMIN D (ERGOCALCIFEROL) 1.25 MG (50000 UNIT) PO CAPS: 50000.0000 [IU] | ORAL_CAPSULE | ORAL | 0 refills | Status: DC

## 2015-11-08 NOTE — Addendum Note (Signed)
Addended by: Abbe AmsterdamOPLAND, Dominico Rod C on: 11/08/2015 09:39 PM   Modules accepted: Orders

## 2015-11-08 NOTE — Progress Notes (Signed)
Pre visit review using our clinic review tool, if applicable. No additional management support is needed unless otherwise documented below in the visit note. 

## 2015-11-08 NOTE — Progress Notes (Addendum)
[facility] [address], [address] [place], [place] [address] [phone]  Date:  11/08/2015   Name:  Alison Moss   DOB:  12-Nov-1990   MRN:  [id number]  PCP:  Abbe Amsterdam, MD    Chief Complaint: Establish Care (Pt here to est care. Would like to discuss sx's that she has every 3 months of her cycle. Sx's include fatigue, nausea, sensative to smells, back cramping, bloating and emotional.)   History of Present Illness:  Alison Moss is a 25 y.o. very pleasant female patient who presents with the following:  Generally healthy young woman here today to establish care.   She is here today with her 85 month old son.    She does see a GYN but has not seen primary care in some time She has lived here all her life-she did leave for college but returned here to live.  She is staying at home with her son right now.   She did have a breast cyst and had a bx in middle school- it was normal.  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 This has not been an issue in the last 4+ years  She did have asthma as a child- she grew out of this, does not have sx except when she is exposed to very cold air and exercise at the same time  Her mother does have MS, but she is doing very well. Her sx have been stable   Her parents live in [place] as well.   She is using condoms for contraception at his time.  She will have some pre-menstrual symptoms that start mid cycle and then stop immediately with onset of her menses.  She may have nausea, bloating, fatigue, bad mood; this only occurs with about every 3rd cycle.  She has regular cycles every 27 days or so  She declines a flu shot this year.  Discussed need to help protect her young son from flu exposure  She uses B12 nasal spray as she is a vegan and is concerned about vitamin D.  She is not fasting but would like to go ahead and do labs today if ok   Patient Active  Problem List   Diagnosis Date Noted  . Active labor 02/07/2014    Past Medical History:  Diagnosis Date  . Anemia   . Asthma   . Breast cyst     Past Surgical History:  Procedure Laterality Date  . BREAST BIOPSY Left   . NO PAST SURGERIES      Social History  Substance Use Topics  . Smoking status: Never Smoker  . Smokeless tobacco: Never Used  . Alcohol use No    Family History  Problem Relation Age of Onset  . Multiple sclerosis Mother     Allergies  Allergen Reactions  . Iodine Hives  . Shellfish Allergy Hives    Medication list has been reviewed and updated.  Current Outpatient Prescriptions on File Prior to Visit  Medication Sig Dispense Refill  . Prenatal Vit-Fe Fumarate-FA (PRENATAL MULTIVITAMIN) TABS tablet Take 1 tablet by mouth daily at 12 noon.     No current facility-administered medications on file prior to visit.     Review of Systems:  As per HPI- otherwise negative.  She wonders about a hormone imbalance; however she has normal regular menses and got pregnant with her son "on the first try" so  counseled her that her pre-menstrual sx are likely physiologic   Physical Examination: Vitals:   11/08/15 1044  BP: 111/76  Pulse: 82  Temp: 98 F (36.7 C)   Vitals:   11/08/15 1044  Weight: 117 lb (53.1 kg)  Height: 5\' 1"  (1.549 m)   Body mass index is 22.11 kg/m. Ideal Body Weight: Weight in (lb) to have BMI = 25: 132  GEN: WDWN, NAD, Non-toxic, A & O x 3, looks well and very healthy  HEENT: Atraumatic, Normocephalic. Neck supple. No masses, No LAD. Ears and Nose: No external deformity. CV: RRR, No M/G/R. No JVD. No thrill. No extra heart sounds. PULM: CTA B, no wheezes, crackles, rhonchi. No retractions. No resp. distress. No accessory muscle use. ABD: S, NT, ND, +BS. No rebound. No HSM. EXTR: No c/c/e NEURO Normal gait.  PSYCH: Normally interactive. Conversant. Not depressed or anxious appearing.  Calm demeanor.    Assessment and  Plan: Pre-menstrual syndrome  Vegan diet - Plan: Folate, B12  Screening for deficiency anemia - Plan: CBC  Screening for thyroid disorder - Plan: TSH  Encounter for vitamin deficiency screening - Plan: Comprehensive metabolic panel, Vitamin D (25 hydroxy), Folate, B12  Screening for hyperlipidemia - Plan: Lipid panel  Here today to establish care and discuss a few concerns.  Will check labs as above, suggested ibuprofen for her pre-menstrual sx as needed   Signed Abbe AmsterdamJessica Maylynn Orzechowski, MD  Results for orders placed or performed in visit on 11/08/15  CBC  Result Value Ref Range   WBC 6.7 4.0 - 10.5 K/uL   RBC 4.14 3.87 - 5.11 Mil/uL   Platelets 302.0 150.0 - 400.0 K/uL   Hemoglobin 12.5 12.0 - 15.0 g/dL   HCT 21.337.3 08.636.0 - 57.846.0 %   MCV 90.2 78.0 - 100.0 fl   MCHC 33.5 30.0 - 36.0 g/dL   RDW 46.913.1 62.911.5 - 52.815.5 %  Comprehensive metabolic panel  Result Value Ref Range   Sodium 139 135 - 145 mEq/L   Potassium 4.0 3.5 - 5.1 mEq/L   Chloride 104 96 - 112 mEq/L   CO2 28 19 - 32 mEq/L   Glucose, Bld 78 70 - 99 mg/dL   BUN 5 (L) 6 - 23 mg/dL   Creatinine, Ser 4.130.57 0.40 - 1.20 mg/dL   Total Bilirubin 0.6 0.2 - 1.2 mg/dL   Alkaline Phosphatase 87 39 - 117 U/L   AST 11 0 - 37 U/L   ALT 8 0 - 35 U/L   Total Protein 7.8 6.0 - 8.3 g/dL   Albumin 4.5 3.5 - 5.2 g/dL   Calcium 9.9 8.4 - 24.410.5 mg/dL   GFR 010.27136.71 >25.36>60.00 mL/min  Lipid panel  Result Value Ref Range   Cholesterol 127 0 - 200 mg/dL   Triglycerides 64.463.0 0.0 - 149.0 mg/dL   HDL 03.4745.70 >42.59>39.00 mg/dL   VLDL 56.312.6 0.0 - 87.540.0 mg/dL   LDL Cholesterol 68 0 - 99 mg/dL   Total CHOL/HDL Ratio 3    NonHDL 80.85   TSH  Result Value Ref Range   TSH 1.90 0.35 - 4.50 uIU/mL  Vitamin D (25 hydroxy)  Result Value Ref Range   VITD 15.42 (L) 30.00 - 100.00 ng/mL  Folate  Result Value Ref Range   Folate >23.4 >5.9 ng/mL  B12  Result Value Ref Range   Vitamin B-12 >1500 (H) 211 - 911 pg/mL   Labs normal except for low vitamin D.  Will rx  supplement for her- see result  note sent to pt

## 2015-11-08 NOTE — Patient Instructions (Signed)
It was very nice to see you and Jacquenette ShoneJulian today!  We will get your lab work and screen for B12, folate, vitamin D def. I will be in touch with your labs.  You might try using ibuprofen, 400 mg 2 or 3x a day during the time you have pre-menstrual symptoms.  If this does not help of course you can stop using this regimen.  Let me know if you symptoms worsen or change

## 2015-11-09 NOTE — Telephone Encounter (Signed)
I have called her a couple of times today to discuss her vitamin D supplementation- so far not able to reach her.  Did talk to pharmacist prior to them filling med- they will attach a note asking the pt to discuss with me prior to taking her vitamin D.  I will continue to try and reach her

## 2015-11-20 ENCOUNTER — Encounter: Payer: Self-pay | Admitting: Family Medicine

## 2016-11-22 DIAGNOSIS — Z6823 Body mass index (BMI) 23.0-23.9, adult: Secondary | ICD-10-CM | POA: Diagnosis not present

## 2016-11-22 DIAGNOSIS — Z01419 Encounter for gynecological examination (general) (routine) without abnormal findings: Secondary | ICD-10-CM | POA: Diagnosis not present

## 2016-12-02 ENCOUNTER — Encounter: Payer: Self-pay | Admitting: Family Medicine

## 2016-12-02 ENCOUNTER — Ambulatory Visit: Payer: BLUE CROSS/BLUE SHIELD | Admitting: Family Medicine

## 2016-12-02 VITALS — BP 104/62 | HR 70 | Temp 98.3°F | Ht 60.0 in | Wt 123.6 lb

## 2016-12-02 DIAGNOSIS — J209 Acute bronchitis, unspecified: Secondary | ICD-10-CM | POA: Diagnosis not present

## 2016-12-02 DIAGNOSIS — J029 Acute pharyngitis, unspecified: Secondary | ICD-10-CM | POA: Diagnosis not present

## 2016-12-02 LAB — POCT RAPID STREP A (OFFICE): RAPID STREP A SCREEN: NEGATIVE

## 2016-12-02 MED ORDER — AZITHROMYCIN 250 MG PO TABS: ORAL_TABLET | ORAL | 0 refills | Status: DC

## 2016-12-02 NOTE — Progress Notes (Signed)
[facility] [address], [address] [place], [place] [address] 336 [phone] [phone]  Date:  12/02/2016   Name:  Alison CokeMollie P Moss   DOB:  May 28, 1990   MRN:  [id number]  PCP:  Pearline Cablesopland, [name], MD    Chief Complaint: Nasal Congestion (c/o prod cough with green mucus, nasal and chest congrestion x 2 week. )   History of Present Illness:  Alison Moss is a 26 y.o. very pleasant female patient who presents with the following:  I saw her about a year ago Here today with illness- she has noted sx for about 2 weeks She is coughing a lot, this is giving her a HA. She notes chest congestion and is coughing up material, her sinuses are congested, ST Her sx are more in her chest, but when she wakes up in the am her sinuses will be very congested  She has not noted a fever She had one episode of post- tussive emesis- otherwise no vomiting or diarrhea She is eating ok   Here today with her son who is nearly 703 yo- he is sick too but is otherwise doing well She has tried a chest decongestant and cough suppressant which helps for a bit  She is not nursing any longer. She is back to work and is doing well  Her LMP was about one week ago, using nuva ring  Patient Active Problem List   Diagnosis Date Noted  . Vegan diet 11/08/2015  . Pre-menstrual syndrome 11/08/2015  . Active labor 02/07/2014    Past Medical History:  Diagnosis Date  . Anemia   . Asthma   . Breast cyst     Past Surgical History:  Procedure Laterality Date  . BREAST BIOPSY Left   . NO PAST SURGERIES      Social History   Tobacco Use  . Smoking status: Never Smoker  . Smokeless tobacco: Never Used  Substance Use Topics  . Alcohol use: No  . Drug use: No    Family History  Problem Relation Age of Onset  . Multiple sclerosis Mother     Allergies  Allergen Reactions  . Iodine Hives  . Shellfish Allergy Hives    Medication list has been reviewed and  updated.  Current Outpatient Medications on File Prior to Visit  Medication Sig Dispense Refill  . Nutritional Supplements (JUICE PLUS FIBRE PO) Take 4 capsules by mouth daily.    . Prenatal Vit-Fe Fumarate-FA (PRENATAL MULTIVITAMIN) TABS tablet Take 1 tablet by mouth daily at 12 noon.    . Vitamin D, Ergocalciferol, (DRISDOL) 50000 units CAPS capsule Take 1 capsule (50,000 Units total) by mouth every 7 (seven) days. 12 capsule 0   No current facility-administered medications on file prior to visit.     Review of Systems:  As per HPI- otherwise negative. No fever   Physical Examination: There were no vitals filed for this visit. Vitals:   12/02/16 1139  Weight: 123 lb 9.6 oz (56.1 kg)  Height: 5' (1.524 m)   Body mass index is 24.14 kg/m. Ideal Body Weight: Weight in (lb) to have BMI = 25: 127.7  GEN: WDWN, NAD, Non-toxic, A & O x 3 HEENT: Atraumatic, Normocephalic. Neck supple. No masses, No LAD.  Bilateral TM wnl, oropharynx normal.  PEERL,EOMI.   Ears and Nose: No external deformity. CV: RRR, No M/G/R. No JVD. No thrill. No extra heart sounds. PULM: CTA B, no wheezes, crackles, rhonchi.  No retractions. No resp. distress. No accessory muscle use. ABD: S, NT, ND. No rebound. No HSM. EXTR: No c/c/e NEURO Normal gait.  PSYCH: Normally interactive. Conversant. Not depressed or anxious appearing.  Calm demeanor.  Looks well, here today with her adorable young son   Results for orders placed or performed in visit on 12/02/16  POCT rapid strep A  Result Value Ref Range   Rapid Strep A Screen Negative Negative    Assessment and Plan: Acute bronchitis, unspecified organism - Plan: azithromycin (ZITHROMAX) 250 MG tablet  Sore throat - Plan: POCT rapid strep A  Productive cough for 2 weeks Will treat with azithromycin Pt notes that she did have some sort of allergic reaction- hives- to an abx she took for mastitis in the past but she cannot recall the name,  However she  feels sure it was not azithromycin   See patient instructions for more details.     Signed Abbe AmsterdamJessica Copland, MD

## 2016-12-02 NOTE — Patient Instructions (Signed)
It was good to see you today- I hope that you feel much better soon! Use the azithromycin as directed.  Please let me know if your are not improved in the next few days

## 2017-01-10 ENCOUNTER — Ambulatory Visit: Payer: BLUE CROSS/BLUE SHIELD | Admitting: Medical

## 2017-01-10 ENCOUNTER — Encounter: Payer: Self-pay | Admitting: Medical

## 2017-01-10 VITALS — BP 108/55 | HR 106 | Temp 99.9°F | Resp 16 | Ht 60.0 in | Wt 120.0 lb

## 2017-01-10 DIAGNOSIS — R52 Pain, unspecified: Secondary | ICD-10-CM

## 2017-01-10 DIAGNOSIS — J029 Acute pharyngitis, unspecified: Secondary | ICD-10-CM

## 2017-01-10 LAB — POCT INFLUENZA A/B
INFLUENZA B, POC: NEGATIVE
Influenza A, POC: NEGATIVE

## 2017-01-10 LAB — POCT RAPID STREP A (OFFICE): RAPID STREP A SCREEN: NEGATIVE

## 2017-01-10 MED ORDER — PENICILLIN G BENZATHINE 1200000 UNIT/2ML IM SUSP
1.2000 10*6.[IU] | Freq: Once | INTRAMUSCULAR | Status: AC
  Administered 2017-01-10: 1.2 10*6.[IU] via INTRAMUSCULAR

## 2017-01-10 NOTE — Progress Notes (Signed)
Subjective:    Patient ID: Alison Moss, female    DOB: [birth year], 26 y.o.   MRN: [id number]  HPI  Pt in with some cough and runny nose on Monday. Wednesday she got st and it is getting worse. Also has some body aches. Some fever last night and today.  No vomiting. No ha or neck stiffness.  LMP- 7 months ago. Pt has nuvaring and uses it continuously.   Review of Systems  Constitutional: Positive for fatigue. Negative for chills and fever.  HENT: Positive for congestion and sore throat. Negative for facial swelling, nosebleeds, postnasal drip, rhinorrhea, sinus pain and trouble swallowing.   Respiratory: Negative for cough, chest tightness and shortness of breath.   Cardiovascular: Negative for chest pain and palpitations.  Gastrointestinal: Negative for abdominal pain.  Genitourinary: Negative for dysuria, flank pain and frequency.  Musculoskeletal: Positive for myalgias.  Skin: Negative for rash.  Neurological: Negative for facial asymmetry and headaches.  Hematological: Negative for adenopathy. Does not bruise/bleed easily.  Psychiatric/Behavioral: Negative for behavioral problems and confusion.    Past Medical History:  Diagnosis Date  . Anemia   . Asthma   . Breast cyst      Social History   Socioeconomic History  . Marital status: Married    Spouse name: Not on file  . Number of children: Not on file  . Years of education: Not on file  . Highest education level: Not on file  Social Needs  . Financial resource strain: Not on file  . Food insecurity - worry: Not on file  . Food insecurity - inability: Not on file  . Transportation needs - medical: Not on file  . Transportation needs - non-medical: Not on file  Occupational History  . Not on file  Tobacco Use  . Smoking status: Never Smoker  . Smokeless tobacco: Never Used  Substance and Sexual Activity  . Alcohol use: No  . Drug use: No  . Sexual activity: Yes    Birth control/protection: None    Other Topics Concern  . Not on file  Social History Narrative  . Not on file    Past Surgical History:  Procedure Laterality Date  . BREAST BIOPSY Left   . NO PAST SURGERIES      Family History  Problem Relation Age of Onset  . Multiple sclerosis Mother     Allergies  Allergen Reactions  . Iodine Hives  . Shellfish Allergy Hives    Current Outpatient Medications on File Prior to Visit  Medication Sig Dispense Refill  . NUVARING 0.12-0.015 MG/24HR vaginal ring INSERT 1 RING AS DIRECTED AND CHANGE MONTHLY  4   No current facility-administered medications on file prior to visit.     BP (!) 108/55 (BP Location: Right Arm, Patient Position: Sitting, Cuff Size: Small)   Pulse (!) 106   Temp 99.9 F (37.7 C) (Oral)   Resp 16   Ht 5' (1.524 m)   Wt 120 lb (54.4 kg)   SpO2 100%   BMI 23.44 kg/m       Objective:   Physical Exam  General  Mental Status - Alert. General Appearance - Well groomed. Not in acute distress.  Skin Rashes- No Rashes.  HEENT Head- Normal. Ear Auditory Canal - Left- Normal. Right - Normal.Tympanic Membrane- Left- Normal. Right- Normal. Eye Sclera/Conjunctiva- Left- Normal. Right- Normal. Nose & Sinuses Nasal Mucosa- Left-  Boggy and Congested. Right-  Boggy and  Congested.Bilateral  No maxillary and no  frontal sinus pressure. Mouth & Throat Lips: Upper Lip- Normal: no dryness, cracking, pallor, cyanosis, or vesicular eruption. Lower Lip-Normal: no dryness, cracking, pallor, cyanosis or vesicular eruption. Buccal Mucosa- Bilateral- No Aphthous ulcers. Oropharynx- No Discharge or Erythema. Tonsils: Characteristics- Bilateral-Bright erythema. Size/Enlargement- Bilateral- 1+ enlargement. Discharge- both sides mild discharge.  Neck Neck- Supple. No Masses.    Chest and Lung Exam Auscultation: Breath Sounds:-Clear even and unlabored.  Cardiovascular Auscultation:Rythm- Regular, rate and rhythm. Murmurs & Other Heart  Sounds:Ausculatation of the heart reveal- No Murmurs.  Lymphatic Head & Neck General Head & Neck Lymphatics: Bilateral: Description- Bilateral enlarged submandibular nodes swollen.       Assessment & Plan:  Your rapid strep test was negative butb y level of pain described, enlarged lymph nodes and appearance of posterior pharynx, I think the rapid strep test is falsely negative.  You have describes severe pain swallowing and we will treat with Bicillin IM injection today.  Also want to go ahead and do send out throat culture.  I want to confirm the suspicion that the rapid strep test is falsely negative.    Recommend using ibuprofen for body aches, fever and sore throat.  Follow-up in 7 days or as needed.  Jaisha Villacres, Ramon DredgeEdward, PA-C

## 2017-01-10 NOTE — Patient Instructions (Addendum)
Your rapid strep test was negative but by level of pain described, enlarged lymph nodes and appearance of posterior pharynx, I think the rapid strep test is falsely negative.  You have describes severe pain swallowing and we will treat with Bicillin IM injection today.  Also want to go ahead and do send out throat culture.  I want to confirm the suspicion that the rapid strep test is falsely negative.    Recommend using ibuprofen for body aches, fever and sore throat.  Follow-up in 7 days or as needed.

## 2017-01-12 ENCOUNTER — Encounter: Payer: Self-pay | Admitting: Medical

## 2017-01-12 LAB — CULTURE, GROUP A STREP
MICRO NUMBER:: 81457387
SPECIMEN QUALITY:: ADEQUATE

## 2017-01-21 DIAGNOSIS — F411 Generalized anxiety disorder: Secondary | ICD-10-CM | POA: Diagnosis not present

## 2017-01-21 DIAGNOSIS — F331 Major depressive disorder, recurrent, moderate: Secondary | ICD-10-CM | POA: Diagnosis not present

## 2017-01-22 NOTE — Progress Notes (Addendum)
[facility] [address] [place], [place] [address] [phone]  Date:  01/23/2017   Name:  Alison Moss   DOB:  May 07, 1990   MRN:  [id number]  PCP:  Pearline Cables, MD    Chief Complaint: No chief complaint on file.   History of Present Illness:  Alison Moss is a 27 y.o. very pleasant female patient who presents with the following:  Here today for a physical and labs She would like to do labs today Last seen by myself for a sick visit in November of 18  She was here on 12/28 and was given bicillin for pharyngitis- she noted some vaginal spotting after this shot.  This is going away but she wondered if this means her contraception is not working. Advised her that her nuva ring is most likely still effective, but as we do not know for sure condoms for the next 2 weeks would be wise if she does not desire pregnancy  Pap:she sees GYN, [facility] KDX:IPJASNKN today tdap is UTD Labs:  2017, CMP, CBC, lipids, TSH Her nearly 69 yo son is doing well. They recently got a cat.  They are working on [employer] training She notes that she does have some issues with sleep- this is not really new.  She is seeing a therapist, and has been dx with depression and GAD.  At this time she notes that she is doing pretty well with therapy, and does not wish to be on medication for depression However, she has used trazodone for sleep in the past and did well with this, she would like to have an rx to try for her sleep difficulty  She is no longer nursing her son  No issues with thinking of self harm She is fasting this morning She did have some wheezing with illness recently, but otherwise her asthma is not really bothersome Never a smoker  She is planning on stopping her "junk food vegan diet and starting a more whole foods plant based diet."  She wonders if she needs to have extensive testing for vitamin and mineral deficiency.  Advised her that she is unlikely to have a particular vitamin deficiency in the [place], but she does have history of low vit D and low iron so we will check these for her today   Patient Active Problem List   Diagnosis Date Noted  . Vegan diet 11/08/2015  . Pre-menstrual syndrome 11/08/2015    Past Medical History:  Diagnosis Date  . Anemia   . Asthma   . Breast cyst     Past Surgical History:  Procedure Laterality Date  . BREAST BIOPSY Left   . NO PAST SURGERIES      Social History   Tobacco Use  . Smoking status: Never Smoker  . Smokeless tobacco: Never Used  Substance Use Topics  . Alcohol use: No  . Drug use: No    Family History  Problem Relation Age of Onset  . Multiple sclerosis Mother     Allergies  Allergen Reactions  . Iodine Hives  . Shellfish Allergy Hives    Medication list has been reviewed and updated.  Current Outpatient Medications on File Prior to Visit  Medication Sig Dispense Refill  . NUVARING 0.12-0.015 MG/24HR vaginal ring INSERT 1 RING AS DIRECTED AND CHANGE MONTHLY  4   No current facility-administered medications on file prior to visit.  Review of Systems:  As per HPI- otherwise negative.   Physical Examination: Vitals:   01/23/17 0904  BP: 108/62  Pulse: 77  Resp: 16  Temp: 98.1 F (36.7 C)  SpO2: 99%   Vitals:   01/23/17 0904  Weight: 120 lb 9.6 oz (54.7 kg)  Height: 5\' 5"  (1.651 m)   Body mass index is 20.07 kg/m. Ideal Body Weight: Weight in (lb) to have BMI = 25: 149.9  GEN: WDWN, NAD, Non-toxic, A & O x 3, slender build, looks well HEENT: Atraumatic, Normocephalic. Neck supple. No masses, No LAD.  Bilateral TM wnl, oropharynx normal.  PEERL,EOMI.   Ears and Nose: No external deformity. CV: RRR, No M/G/R. No JVD. No thrill. No extra heart sounds. PULM: CTA B, no wheezes, crackles, rhonchi. No retractions. No resp. distress. No accessory muscle use. ABD: S, NT, ND, +BS. No rebound. No HSM. EXTR: No  c/c/e NEURO Normal gait.  PSYCH: Normally interactive. Conversant. Not depressed or anxious appearing.  Calm demeanor.    Assessment and Plan: Physical exam  Sleeping difficulty - Plan: TSH, traZODone (DESYREL) 50 MG tablet  History of vitamin D deficiency - Plan: Vitamin D (25 hydroxy)  History of iron deficiency - Plan: CBC, Ferritin  Screening for hyperlipidemia - Plan: Lipid panel  Screening for diabetes mellitus - Plan: Comprehensive metabolic panel  GAD (generalized anxiety disorder) - Plan: TSH  CPE- labs pending as above Will rx trazodone for sleep for her She does not wish to be on any other medication for depression right now- she will let me know if not doing ok See patient instructions for more details.     Signed Abbe Amsterdam, MD  Received her labs- message to pt:  Blood count is normal- no anemia Metabolic profile is normal Cholesterol is excellent  Thyroid normal Your iron is ok- on the low side of normal but still normal.   Only concern is your vitamin D- it is low still!  I am going to send in an rx vitamin D that you will take once a WEEK for 12 weeks.  Following that course please change to an OTC vitamin D with 2,000 IU daily   Results for orders placed or performed in visit on 01/23/17  CBC  Result Value Ref Range   WBC 5.5 4.0 - 10.5 K/uL   RBC 4.07 3.87 - 5.11 Mil/uL   Platelets 324.0 150.0 - 400.0 K/uL   Hemoglobin 12.2 12.0 - 15.0 g/dL   HCT 09.8 11.9 - 14.7 %   MCV 91.8 78.0 - 100.0 fl   MCHC 32.7 30.0 - 36.0 g/dL   RDW 82.9 56.2 - 13.0 %  Comprehensive metabolic panel  Result Value Ref Range   Sodium 137 135 - 145 mEq/L   Potassium 4.1 3.5 - 5.1 mEq/L   Chloride 102 96 - 112 mEq/L   CO2 27 19 - 32 mEq/L   Glucose, Bld 91 70 - 99 mg/dL   BUN 7 6 - 23 mg/dL   Creatinine, Ser 8.65 0.40 - 1.20 mg/dL   Total Bilirubin 0.9 0.2 - 1.2 mg/dL   Alkaline Phosphatase 53 39 - 117 U/L   AST 10 0 - 37 U/L   ALT 7 0 - 35 U/L   Total Protein  7.5 6.0 - 8.3 g/dL   Albumin 4.2 3.5 - 5.2 g/dL   Calcium 9.2 8.4 - 78.4 mg/dL   GFR 696.29 >52.84 mL/min  Lipid panel  Result Value Ref Range  Cholesterol 153 0 - 200 mg/dL   Triglycerides 478.2123.0 0.0 - 149.0 mg/dL   HDL 95.6252.80 >13.08>39.00 mg/dL   VLDL 65.724.6 0.0 - 84.640.0 mg/dL   LDL Cholesterol 76 0 - 99 mg/dL   Total CHOL/HDL Ratio 3    NonHDL 100.42   TSH  Result Value Ref Range   TSH 3.18 0.35 - 4.50 uIU/mL  Vitamin D (25 hydroxy)  Result Value Ref Range   VITD 18.53 (L) 30.00 - 100.00 ng/mL  Ferritin  Result Value Ref Range   Ferritin 13.5 10.0 - 291.0 ng/mL

## 2017-01-23 ENCOUNTER — Encounter: Payer: Self-pay | Admitting: Family Medicine

## 2017-01-23 ENCOUNTER — Ambulatory Visit (INDEPENDENT_AMBULATORY_CARE_PROVIDER_SITE_OTHER): Payer: BLUE CROSS/BLUE SHIELD | Admitting: Family Medicine

## 2017-01-23 VITALS — BP 108/62 | HR 77 | Temp 98.1°F | Resp 16 | Ht 65.0 in | Wt 120.6 lb

## 2017-01-23 DIAGNOSIS — Z13220 Encounter for screening for lipoid disorders: Secondary | ICD-10-CM | POA: Diagnosis not present

## 2017-01-23 DIAGNOSIS — E559 Vitamin D deficiency, unspecified: Secondary | ICD-10-CM

## 2017-01-23 DIAGNOSIS — Z0001 Encounter for general adult medical examination with abnormal findings: Secondary | ICD-10-CM

## 2017-01-23 DIAGNOSIS — G479 Sleep disorder, unspecified: Secondary | ICD-10-CM | POA: Diagnosis not present

## 2017-01-23 DIAGNOSIS — Z8639 Personal history of other endocrine, nutritional and metabolic disease: Secondary | ICD-10-CM

## 2017-01-23 DIAGNOSIS — Z0000 Encounter for general adult medical examination without abnormal findings: Secondary | ICD-10-CM

## 2017-01-23 DIAGNOSIS — F411 Generalized anxiety disorder: Secondary | ICD-10-CM | POA: Diagnosis not present

## 2017-01-23 DIAGNOSIS — Z131 Encounter for screening for diabetes mellitus: Secondary | ICD-10-CM

## 2017-01-23 LAB — COMPREHENSIVE METABOLIC PANEL
ALT: 7 U/L (ref 0–35)
AST: 10 U/L (ref 0–37)
Albumin: 4.2 g/dL (ref 3.5–5.2)
Alkaline Phosphatase: 53 U/L (ref 39–117)
BUN: 7 mg/dL (ref 6–23)
CO2: 27 meq/L (ref 19–32)
Calcium: 9.2 mg/dL (ref 8.4–10.5)
Chloride: 102 mEq/L (ref 96–112)
Creatinine, Ser: 0.57 mg/dL (ref 0.40–1.20)
GFR: 135.44 mL/min (ref >60.00)
Glucose, Bld: 91 mg/dL (ref 70–99)
POTASSIUM: 4.1 meq/L (ref 3.5–5.1)
Sodium: 137 mEq/L (ref 135–145)
Total Bilirubin: 0.9 mg/dL (ref 0.2–1.2)
Total Protein: 7.5 g/dL (ref 6.0–8.3)

## 2017-01-23 LAB — CBC
HCT: 37.4 % (ref 36.0–46.0)
Hemoglobin: 12.2 g/dL (ref 12.0–15.0)
MCHC: 32.7 g/dL (ref 30.0–36.0)
MCV: 91.8 fl (ref 78.0–100.0)
Platelets: 324 10*3/uL (ref 150.0–400.0)
RBC: 4.07 Mil/uL (ref 3.87–5.11)
RDW: 12.5 % (ref 11.5–15.5)
WBC: 5.5 10*3/uL (ref 4.0–10.5)

## 2017-01-23 LAB — LIPID PANEL
CHOL/HDL RATIO: 3
Cholesterol: 153 mg/dL (ref 0–200)
HDL: 52.8 mg/dL (ref >39.00)
LDL CALC: 76 mg/dL (ref 0–99)
NONHDL: 100.42
Triglycerides: 123 mg/dL (ref 0.0–149.0)
VLDL: 24.6 mg/dL (ref 0.0–40.0)

## 2017-01-23 LAB — FERRITIN: Ferritin: 13.5 ng/mL (ref 10.0–291.0)

## 2017-01-23 LAB — VITAMIN D 25 HYDROXY (VIT D DEFICIENCY, FRACTURES): VITD: 18.53 ng/mL — ABNORMAL LOW (ref 30.00–100.00)

## 2017-01-23 LAB — TSH: TSH: 3.18 u[IU]/mL (ref 0.35–4.50)

## 2017-01-23 MED ORDER — VITAMIN D (ERGOCALCIFEROL) 1.25 MG (50000 UNIT) PO CAPS: 50000.0000 [IU] | ORAL_CAPSULE | ORAL | 0 refills | Status: DC

## 2017-01-23 MED ORDER — TRAZODONE HCL 50 MG PO TABS: 25.0000 mg | ORAL_TABLET | Freq: Every evening | ORAL | 5 refills | Status: DC | PRN

## 2017-01-23 NOTE — Addendum Note (Signed)
Addended by: Abbe AmsterdamOPLAND, [name] on: 01/23/2017 02:35 PM   Modules accepted: Orders

## 2017-01-23 NOTE — Patient Instructions (Addendum)
It was very nice to see you today!   I will be in touch with your labs asap Your idea of working on a more whole food, plant based diet sounds like a good one.    We will try trazodone for you for sleep- let me know if this is not helpful for you

## 2017-01-31 DIAGNOSIS — F411 Generalized anxiety disorder: Secondary | ICD-10-CM | POA: Diagnosis not present

## 2017-01-31 DIAGNOSIS — F331 Major depressive disorder, recurrent, moderate: Secondary | ICD-10-CM | POA: Diagnosis not present

## 2017-02-13 DIAGNOSIS — F331 Major depressive disorder, recurrent, moderate: Secondary | ICD-10-CM | POA: Diagnosis not present

## 2017-02-13 DIAGNOSIS — F411 Generalized anxiety disorder: Secondary | ICD-10-CM | POA: Diagnosis not present

## 2017-02-19 ENCOUNTER — Other Ambulatory Visit: Payer: Self-pay | Admitting: Emergency Medicine

## 2017-02-19 DIAGNOSIS — G479 Sleep disorder, unspecified: Secondary | ICD-10-CM

## 2017-02-19 MED ORDER — TRAZODONE HCL 50 MG PO TABS: 25.0000 mg | ORAL_TABLET | Freq: Every evening | ORAL | 3 refills | Status: DC | PRN

## 2017-02-19 NOTE — Telephone Encounter (Signed)
Received 90 day supply refill request for Trazodone 50 mg tablet. Last office visit and refill 01/23/2017. Please advise.

## 2017-02-20 ENCOUNTER — Encounter: Payer: Self-pay | Admitting: Family Medicine

## 2017-02-20 DIAGNOSIS — F411 Generalized anxiety disorder: Secondary | ICD-10-CM | POA: Diagnosis not present

## 2017-02-20 DIAGNOSIS — F331 Major depressive disorder, recurrent, moderate: Secondary | ICD-10-CM | POA: Diagnosis not present

## 2017-02-21 ENCOUNTER — Encounter: Payer: Self-pay | Admitting: Medical

## 2017-02-21 ENCOUNTER — Ambulatory Visit: Payer: BLUE CROSS/BLUE SHIELD | Admitting: Medical

## 2017-02-21 VITALS — BP 106/68 | HR 76 | Temp 98.1°F | Resp 16 | Ht 65.0 in | Wt 123.0 lb

## 2017-02-21 DIAGNOSIS — R21 Rash and other nonspecific skin eruption: Secondary | ICD-10-CM | POA: Diagnosis not present

## 2017-02-21 DIAGNOSIS — J0100 Acute maxillary sinusitis, unspecified: Secondary | ICD-10-CM | POA: Diagnosis not present

## 2017-02-21 MED ORDER — PREDNISONE 10 MG PO TABS: ORAL_TABLET | ORAL | 0 refills | Status: DC

## 2017-02-21 MED ORDER — AMOXICILLIN-POT CLAVULANATE 875-125 MG PO TABS: 1.0000 | ORAL_TABLET | Freq: Two times a day (BID) | ORAL | 0 refills | Status: DC

## 2017-02-21 MED ORDER — HYDROXYZINE HCL 25 MG PO TABS: 25.0000 mg | ORAL_TABLET | Freq: Three times a day (TID) | ORAL | 0 refills | Status: DC | PRN

## 2017-02-21 MED ORDER — CLOTRIMAZOLE-BETAMETHASONE 1-0.05 % EX CREA: 1.0000 "application " | TOPICAL_CREAM | Freq: Two times a day (BID) | CUTANEOUS | 0 refills | Status: DC

## 2017-02-21 MED ORDER — FLUTICASONE PROPIONATE 50 MCG/ACT NA SUSP: 2.0000 | Freq: Every day | NASAL | 1 refills | Status: DC

## 2017-02-21 NOTE — Patient Instructions (Addendum)
You  appear to have a sinus infection. I am prescribing  augmentin antibiotic for the infection. To help with the nasal congestion I prescribed flonase  nasal steroid.  For rash spots on left upper arm and abdomen rx lotrisone. Also keep area moisturized.  For diffuse rash on forearms and biceps brief short course taper prednisone.(Can use hydroxyzine if needed for itching)  Rest, hydrate, tylenol for fever.  Follow up in 7 days or as needed.

## 2017-02-21 NOTE — Progress Notes (Signed)
Subjective:    Patient ID: Alison Moss, female    DOB: 09-15-1990, 27 y.o.   MRN: [id number]  HPI  Pt in with some sinus pressure/pain. She states pain for 7 days. Convinced she has infection. Left maxillary side pain. Left teeth pain. When blows nose will get dark brown/yellow with blood tinged appearance to drainage.(hx of sinus infections)  No fever, no chills or sweats.   LMP- on nuvaring.  Also recent faint rash on upper arms since this am. Mild itching. Left arm scaly patch present for 2 weeks. Son has similar patch.   Pt has been real stressed with work recently.    Review of Systems  Constitutional: Negative for chills, fatigue and fever.  HENT: Positive for congestion, sinus pressure and sinus pain. Negative for ear discharge, ear pain, sneezing and voice change.   Respiratory: Negative for cough, chest tightness, shortness of breath and wheezing.   Cardiovascular: Negative for chest pain and palpitations.  Gastrointestinal: Negative for abdominal pain, constipation, diarrhea and vomiting.  Genitourinary: Negative for dysuria and enuresis.  Musculoskeletal: Negative for back pain, joint swelling, neck pain and neck stiffness.  Skin: Positive for rash. Negative for pallor.  Neurological: Negative for dizziness, seizures, syncope, weakness, light-headedness and numbness.  Hematological: Negative for adenopathy. Does not bruise/bleed easily.  Psychiatric/Behavioral: Negative for behavioral problems, confusion and suicidal ideas. The patient is not nervous/anxious and is not hyperactive.     Past Medical History:  Diagnosis Date  . Anemia   . Asthma   . Breast cyst      Social History   Socioeconomic History  . Marital status: Married    Spouse name: Not on file  . Number of children: Not on file  . Years of education: Not on file  . Highest education level: Not on file  Social Needs  . Financial resource strain: Not on file  . Food insecurity - worry:  Not on file  . Food insecurity - inability: Not on file  . Transportation needs - medical: Not on file  . Transportation needs - non-medical: Not on file  Occupational History  . Not on file  Tobacco Use  . Smoking status: Never Smoker  . Smokeless tobacco: Never Used  Substance and Sexual Activity  . Alcohol use: No  . Drug use: No  . Sexual activity: Yes    Birth control/protection: None  Other Topics Concern  . Not on file  Social History Narrative  . Not on file    Past Surgical History:  Procedure Laterality Date  . BREAST BIOPSY Left   . NO PAST SURGERIES      Family History  Problem Relation Age of Onset  . Multiple sclerosis Mother     Allergies  Allergen Reactions  . Iodine Hives  . Shellfish Allergy Hives    Current Outpatient Medications on File Prior to Visit  Medication Sig Dispense Refill  . NUVARING 0.12-0.015 MG/24HR vaginal ring INSERT 1 RING AS DIRECTED AND CHANGE MONTHLY  4  . traZODone (DESYREL) 50 MG tablet Take 0.5-1 tablets (25-50 mg total) by mouth at bedtime as needed for sleep. 90 tablet 3  . Vitamin D, Ergocalciferol, (DRISDOL) 50000 units CAPS capsule Take 1 capsule (50,000 Units total) by mouth every 7 (seven) days. Take for 12 weeks 12 capsule 0   No current facility-administered medications on file prior to visit.     BP 106/68   Pulse 76   Temp 98.1 F (36.7 C) (Oral)  Resp 16   Ht 5\' 5"  (1.651 m)   Wt 123 lb (55.8 kg)   SpO2 100%   BMI 20.47 kg/m       Objective:   Physical Exam   General  Mental Status - Alert. General Appearance - Well groomed. Not in acute distress.  Skin On patient's left triceps area of the region has 10 mm x 10 mm dry flaky patch of skin.  Also 2 smaller areas on her upper abdomen with same appearance.  No redness warmth or tenderness.  No discharge.  In addition on both upper extremities ventral aspects from the wrist towards her biceps she has scattered small tiny papular eruption.  No  vesicles seen.  HEENT Head- Normal. Ear Auditory Canal - Left- Normal. Right - Normal.Tympanic Membrane- Left- Normal. Right- Normal. Eye Sclera/Conjunctiva- Left- Normal. Right- Normal. Nose & Sinuses Nasal Mucosa- Left-  Boggy and Congested. Right-  Boggy and  Congested.Left side maxillary sinus pressure but no frontal sinus pressure. Mouth & Throat Lips: Upper Lip- Normal: no dryness, cracking, pallor, cyanosis, or vesicular eruption. Lower Lip-Normal: no dryness, cracking, pallor, cyanosis or vesicular eruption. Buccal Mucosa- Bilateral- No Aphthous ulcers. Oropharynx- No Discharge or Erythema. Tonsils: Characteristics- Bilateral- No Erythema or Congestion. Size/Enlargement- Bilateral- No enlargement. Discharge- bilateral-None.  Neck Neck- Supple. No Masses.   Chest and Lung Exam Auscultation: Breath Sounds:-Clear even and unlabored.  Cardiovascular Auscultation:Rythm- Regular, rate and rhythm. Murmurs & Other Heart Sounds:Ausculatation of the heart reveal- No Murmurs.  Lymphatic Head & Neck General Head & Neck Lymphatics: Bilateral: Description- No Localized lymphadenopathy.      Assessment & Plan:  You  appear to have a sinus infection. I am prescribing  augmentin antibiotic for the infection. To help with the nasal congestion I prescribed flonase  nasal steroid.  For rash spots on left upper arm and abdomen rx lotrisone. Also keep area moisturized.  For diffuse rash on forearms and biceps brief short course taper prednisone.(Can use hydroxyzine if needed for itching)  Rest, hydrate, tylenol for fever.   Follow up in 7 days or as needed.

## 2017-02-27 DIAGNOSIS — F331 Major depressive disorder, recurrent, moderate: Secondary | ICD-10-CM | POA: Diagnosis not present

## 2017-02-27 DIAGNOSIS — F411 Generalized anxiety disorder: Secondary | ICD-10-CM | POA: Diagnosis not present

## 2017-03-06 DIAGNOSIS — F411 Generalized anxiety disorder: Secondary | ICD-10-CM | POA: Diagnosis not present

## 2017-03-06 DIAGNOSIS — F331 Major depressive disorder, recurrent, moderate: Secondary | ICD-10-CM | POA: Diagnosis not present

## 2017-03-14 DIAGNOSIS — F331 Major depressive disorder, recurrent, moderate: Secondary | ICD-10-CM | POA: Diagnosis not present

## 2017-03-14 DIAGNOSIS — F411 Generalized anxiety disorder: Secondary | ICD-10-CM | POA: Diagnosis not present

## 2017-03-20 DIAGNOSIS — F411 Generalized anxiety disorder: Secondary | ICD-10-CM | POA: Diagnosis not present

## 2017-03-20 DIAGNOSIS — F331 Major depressive disorder, recurrent, moderate: Secondary | ICD-10-CM | POA: Diagnosis not present

## 2017-03-21 ENCOUNTER — Other Ambulatory Visit: Payer: Self-pay | Admitting: Emergency Medicine

## 2017-03-21 MED ORDER — FLUTICASONE PROPIONATE 50 MCG/ACT NA SUSP: 2.0000 | Freq: Every day | NASAL | 1 refills | Status: DC

## 2017-03-27 DIAGNOSIS — F411 Generalized anxiety disorder: Secondary | ICD-10-CM | POA: Diagnosis not present

## 2017-03-27 DIAGNOSIS — F331 Major depressive disorder, recurrent, moderate: Secondary | ICD-10-CM | POA: Diagnosis not present

## 2017-04-03 DIAGNOSIS — F411 Generalized anxiety disorder: Secondary | ICD-10-CM | POA: Diagnosis not present

## 2017-04-03 DIAGNOSIS — F331 Major depressive disorder, recurrent, moderate: Secondary | ICD-10-CM | POA: Diagnosis not present

## 2017-04-10 DIAGNOSIS — F411 Generalized anxiety disorder: Secondary | ICD-10-CM | POA: Diagnosis not present

## 2017-04-10 DIAGNOSIS — F331 Major depressive disorder, recurrent, moderate: Secondary | ICD-10-CM | POA: Diagnosis not present

## 2017-04-17 ENCOUNTER — Other Ambulatory Visit: Payer: Self-pay | Admitting: Family Medicine

## 2017-04-17 DIAGNOSIS — F331 Major depressive disorder, recurrent, moderate: Secondary | ICD-10-CM | POA: Diagnosis not present

## 2017-04-17 DIAGNOSIS — E559 Vitamin D deficiency, unspecified: Secondary | ICD-10-CM

## 2017-04-17 DIAGNOSIS — F411 Generalized anxiety disorder: Secondary | ICD-10-CM | POA: Diagnosis not present

## 2017-04-24 DIAGNOSIS — F411 Generalized anxiety disorder: Secondary | ICD-10-CM | POA: Diagnosis not present

## 2017-04-24 DIAGNOSIS — F331 Major depressive disorder, recurrent, moderate: Secondary | ICD-10-CM | POA: Diagnosis not present

## 2017-04-30 ENCOUNTER — Ambulatory Visit: Payer: BLUE CROSS/BLUE SHIELD | Admitting: Sports Medicine

## 2017-04-30 ENCOUNTER — Encounter: Payer: Self-pay | Admitting: Sports Medicine

## 2017-04-30 ENCOUNTER — Ambulatory Visit: Payer: Self-pay | Admitting: *Deleted

## 2017-04-30 VITALS — BP 118/78 | HR 84 | Ht 65.0 in | Wt 121.4 lb

## 2017-04-30 DIAGNOSIS — G8929 Other chronic pain: Secondary | ICD-10-CM | POA: Diagnosis not present

## 2017-04-30 DIAGNOSIS — M5441 Lumbago with sciatica, right side: Secondary | ICD-10-CM

## 2017-04-30 MED ORDER — METHYLPREDNISOLONE 4 MG PO TBPK: ORAL_TABLET | ORAL | 0 refills | Status: DC

## 2017-04-30 MED ORDER — GABAPENTIN 300 MG PO CAPS: ORAL_CAPSULE | ORAL | 1 refills | Status: DC

## 2017-04-30 NOTE — Patient Instructions (Signed)

## 2017-04-30 NOTE — Progress Notes (Signed)
  Alison FellsMichael D. Moss [name], Moss  [place] [facility] Moss [phone]  [name] - 27 y.o. female MRN [id number]  Date of birth: 1990-12-18  Visit Date: 04/30/2017  PCP: Alison Moss   Referred by: Alison Moss   Scribe for today's visit: Joseph ArtAmber Agner, Moss     SUBJECTIVE:  Alison Moss is here for No chief complaint on file.    Her R hip and low back symptoms INITIALLY: Began Monday morning when she woke up from sleeping.  Rated at 7/8 out of 10.  States that she had no injury leading up to this.  Did slightly twist her back at work 2 weeks prior. Described as moderate , nonradiating. Worsened with sitting or lying down. Improved with lying on the heating pad, but as soon as she gets up to move she is in "intense" pain.  Says that the more walking she does, the less pain she has. Additional associated symptoms include: tingling in the toes of her right foot and a cold sensation in her right leg.  Also feels that she has some weakness in her right leg.    At this time symptoms are worsening compared to onset.  Pain does not seem to be improving despite heat, rest, and ibuprofen.  She has also tried two of her husband's hydrocodone, which also didn't help. She has been using heat and rest as well as ibuprofen.    Denies night time disturbances. Denies fevers, chills, or night sweats. Denies unexplained weight loss. Denies personal history of cancer. Denies changes in bowel or bladder habits. Denies recent unreported falls.   Denies new or worsening dyspnea or wheezing. Denies headaches or dizziness.  Reports numbness, tingling or weakness  In the extremities. Has numbness in the toes of her right foot when she has been sitting or lying down. Denies dizziness or presyncopal episodes Denies lower extremity edema       Please see additional documentation for Objective, Assessment and Plan sections. Pertinent additional  documentation may be included in corresponding procedure notes, imaging studies, problem based documentation and patient instructions. Please see these sections of the encounter for additional information regarding this visit.  Moss/ATC served as Neurosurgeonscribe during this visit. History, Physical, and Plan performed by medical provider. Documentation and orders reviewed and attested to.      Alison MewsMichael D Alison Moss    [place] [facility] Physician

## 2017-04-30 NOTE — Telephone Encounter (Signed)
Pt. C/o back pain and right hip pain since Monday. Reports "it is getting worse. I have weakness in my right leg now." States she "twisted her back 2 weeks ago - but it wasn't really bothering me then." Reports it hurts to sit, stand and walk. Appointment made for today. Reason for Disposition . Numbness in a leg or foot (i.e., loss of sensation)  Answer Assessment - Initial Assessment Questions 1. LOCATION and RADIATION: "Where is the pain located?"      Right hip and back 2. QUALITY: "What does the pain feel like?"  (e.g., sharp, dull, aching, burning)     Constant ache 3. SEVERITY: "How bad is the pain?" "What does it keep you from doing?"   (Scale 1-10; or mild, moderate, severe)   -  MILD (1-3): doesn't interfere with normal activities    -  MODERATE (4-7): interferes with normal activities (e.g., work or school) or awakens from sleep, limping    -  SEVERE (8-10): excruciating pain, unable to do any normal activities, unable to walk     8 4. ONSET: "When did the pain start?" "Does it come and go, or is it there all the time?"     Started Monday 5. WORK OR EXERCISE: "Has there been any recent work or exercise that involved this part of the body?"      No 6. CAUSE: "What do you think is causing the hip pain?"      I twisted my back 2 weeks ago 7. AGGRAVATING FACTORS: "What makes the hip pain worse?" (e.g., walking, climbing stairs, running)     Moving, sitting 8. OTHER SYMPTOMS: "Do you have any other symptoms?" (e.g., back pain, pain shooting down leg,  fever, rash)     Weakness in her right leg  Protocols used: HIP PAIN-A-AH

## 2017-04-30 NOTE — Progress Notes (Signed)
   Alison Moss - 27 y.o. female MRN [id number]  Date of birth: 1990/05/08  Visit Date:   PCP: Pearline Cablesopland, [name], MD   Referred by: Pearline Cablesopland, [name], MD  Please see additional documentation for HPI, review of systems.  HISTORY & PERTINENT PRIOR DATA:  Prior History reviewed and updated per electronic medical record.  Significant/pertinent history, findings, studies include:  reports that she has never smoked. She has never used smokeless tobacco. No results for input(s): HGBA1C, LABURIC, CREATINE in the last 8760 hours. No specialty comments available. No problems updated.  OBJECTIVE:  VS:  HT:5\' 5"  (165.1 cm)   WT:121 lb 6.4 oz (55.1 kg)  BMI:20.2    BP:118/78  HR:84bpm  TEMP: ( )  RESP:98 %   PHYSICAL EXAM: Constitutional: WDWN, Non-toxic appearing. Psychiatric: Alert & appropriately interactive.  Not depressed or anxious appearing. Respiratory: No increased work of breathing.  Trachea Midline Eyes: Pupils are equal.  EOM intact without nystagmus.  No scleral icterus  Vascular Exam: warm to touch no edema  lower extremity neuro exam: d normal strength in bilateral lower extremity's including ability to heel toe walk as she does have some difficulty on the right compared to the left.. diminished DTR in the right S1 Achilles with almost absent reflexes. reduced sensation in the S1 distribution.  MSK Exam: right s1 out Markedly positive straight leg raise on the right with positive ipsilateral straight leg raise on the left localizing to the low back.  Pain with popliteal compression test on the right, normal on the left.    ASSESSMENT & PLAN:   1. Chronic right-sided low back pain with right-sided sciatica      PLAN: Prednisone, gabapentin, amatory referral to physical therapy.  If any lack of improvement will need further diagnostic evaluation with MRI but will defer at this time given acuity of symptoms.  Discussed red flags for cauda equina as well as  indications to call for earlier return including worsening or progressive pain or changes in bowel or bladder.  Will need plain film x-rays at follow-up if any persistent ongoing pain.  Follow-up: Return in about 6 weeks (around 06/11/2017).

## 2017-04-30 NOTE — Telephone Encounter (Signed)
See note

## 2017-04-30 NOTE — Progress Notes (Deleted)
  Alison FellsMichael D. Alison [name], DO  [place] [facility] [phone]  Alison CokeMollie P Moss - 27 y.o. female MRN [id number]  Date of birth: 1990/09/17  Visit Date: 04/30/2017  PCP: Alison Moss, Alison C, MD   Referred by: Alison Moss, Alison C, MD  Scribe for today's visit: Alison FabianMolly Moss, LAT, ATC     SUBJECTIVE:  Alison Moss is here for No chief complaint on file. Marland Kitchen.   Her back and R hip pain symptoms INITIALLY: Began Monday, April 28, 2017 and she feels that the symptoms may be due to "twisting her back" 2 weeks ago. Described as an 8/10 constant, aching pain, {Pain radiation-gi:31246} Worsened with movement  Improved with {alleviating} Additional associated symptoms include: R LE weakness    At this time symptoms are worsening compared to onset {*describe change} She has been {*therapies/medications/ice/heat/bracing} {prev rx:317276}  ROS {ACTIONS;DENIES/REPORTS:21021675::"Denies"} night time disturbances. {ACTIONS;DENIES/REPORTS:21021675::"Denies"} fevers, chills, or night sweats. {ACTIONS;DENIES/REPORTS:21021675::"Denies"} unexplained weight loss. {ACTIONS;DENIES/REPORTS:21021675::"Denies"} personal history of cancer. {ACTIONS;DENIES/REPORTS:21021675::"Denies"} changes in bowel or bladder habits. {ACTIONS;DENIES/REPORTS:21021675::"Denies"} recent unreported falls. {ACTIONS;DENIES/REPORTS:21021675::"Denies"} new or worsening dyspnea or wheezing. {ACTIONS;DENIES/REPORTS:21021675::"Denies"} headaches or dizziness.  {ACTIONS;DENIES/REPORTS:21021675::"Denies"} numbness, tingling or weakness  In the extremities.  {ACTIONS;DENIES/REPORTS:21021675::"Denies"} dizziness or presyncopal episodes {ACTIONS;DENIES/REPORTS:21021675::"Denies"} lower extremity edema   { }

## 2017-05-01 DIAGNOSIS — F411 Generalized anxiety disorder: Secondary | ICD-10-CM | POA: Diagnosis not present

## 2017-05-01 DIAGNOSIS — F331 Major depressive disorder, recurrent, moderate: Secondary | ICD-10-CM | POA: Diagnosis not present

## 2017-05-06 ENCOUNTER — Encounter: Payer: Self-pay | Admitting: Physical Therapy

## 2017-05-06 ENCOUNTER — Ambulatory Visit: Payer: BLUE CROSS/BLUE SHIELD | Admitting: Physical Therapy

## 2017-05-06 DIAGNOSIS — M5441 Lumbago with sciatica, right side: Secondary | ICD-10-CM | POA: Diagnosis not present

## 2017-05-07 NOTE — Therapy (Signed)
[facility] [address] [address] [place], [place], [address] Phone: [phone]   Fax:  [phone]  Physical Therapy Evaluation  Patient Details  Name: Alison CokeMollie P Brickle MRN: [id number] Date of Birth: 02/03/1990 Referring Provider: Gaspar BiddingMichael Rigby   Encounter Date: 05/06/2017  PT End of Session - 05/07/17 0845    Visit Number  1    Number of Visits  12    Date for PT Re-Evaluation  06/17/17    PT Start Time  1600    PT Stop Time  1648    PT Time Calculation (min)  48 min    Activity Tolerance  Patient tolerated treatment well;Patient limited by pain    Behavior During Therapy  [facility] for tasks assessed/performed       Past Medical History:  Diagnosis Date  . Anemia   . Asthma   . Breast cyst     Past Surgical History:  Procedure Laterality Date  . BREAST BIOPSY Left   . NO PAST SURGERIES      There were no vitals filed for this visit.   Subjective Assessment - 05/06/17 1604    Subjective  Pt states increased pain for about 1 week, no injury to report. Increased pain started in R hip and back. She did twist wrong at work. 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  She states mild intermittent radiation of pain into R knee. Numbness was into toes prior to steriod pack.     Limitations  Sitting;Lifting;Standing;Walking;House hold activities    Patient Stated Goals  Decreased pain    Currently in Pain?  Yes    Pain Score  7     Pain Location  Back    Pain Orientation  Lower    Pain Descriptors / Indicators  Aching;Radiating;Dull;Burning    Pain Type  Acute pain    Pain Onset  1 to 4 weeks ago    Pain Frequency  Intermittent         [facility] PT Assessment - 05/07/17 0001      Assessment   Medical Diagnosis  Back pain with R sciatica    Referring Provider  Gaspar BiddingMichael Rigby    Prior Therapy  No      Precautions    Precautions  None      Restrictions   Weight Bearing Restrictions  No      Balance Screen   Has the patient fallen in the past 6 months  No      Prior Function   Level of Independence  Independent      Cognition   Overall Cognitive Status  Within Functional Limits for tasks assessed      ROM / Strength   AROM / PROM / Strength  AROM;Strength      AROM   Overall AROM Comments  Hip ROM: WNL  ;  Lumbar: flexion:significant limitation;  Extension: mild limitation/pain;  SB: WNL;        Strength   Overall Strength Comments  Hips: 4-/5;  Core: 3/5;       Palpation   Palpation comment  Pain in central lumbar spine with palpation of spinous process;  Pain in R glute with deep palpation;  Increased pain with extension and repeated extension;  no pain in SI      Special Tests   Other special tests  Pain with  SLR, minimal tingling;  Negative faber: Positive SI compression on R;                 Objective measurements completed on examination: See above findings.      [facility] Adult PT Treatment/Exercise - 05/07/17 0001      Exercises   Exercises  Lumbar      Lumbar Exercises: Stretches   Single Knee to Chest Stretch  3 reps;30 seconds    Double Knee to Chest Stretch  2 reps;30 seconds    Pelvic Tilt  --    Figure 4 Stretch  3 reps;30 seconds    Figure 4 Stretch Limitations  seated    Other Lumbar Stretch Exercise  Cat/Camel x15; seated lumbar Flexion 30 sec x3 ;       Lumbar Exercises: Supine   Pelvic Tilt  15 reps             PT Education - 05/06/17 1625    Education provided  Yes    Education Details  HEP, activities to avoid    Person(s) Educated  Patient    Methods  Explanation    Comprehension  Verbalized understanding       PT Short Term Goals - 05/07/17 0853      PT SHORT TERM GOAL #1   Title  Pt to be independent with initial HEP     Time  2    Period  Weeks    Status  New    Target Date  05/20/17      PT SHORT TERM GOAL #2   Title  Pt  to report decreased pain to 4/10 with lumbar movement.     Time  2    Period  Weeks    Status  New    Target Date  05/20/17        PT Long Term Goals - 05/07/17 0855      PT LONG TERM GOAL #1   Title  Pt to be independent with final HEP     Time  6    Period  Weeks    Status  New    Target Date  06/17/17      PT LONG TERM GOAL #2   Title  Pt to demo improved lumbar ROM to be WNL and pain free, to improve ability for child care and IADLs.     Time  6    Period  Weeks    Status  New    Target Date  06/17/17      PT LONG TERM GOAL #3   Title  Pt to demo improved strength of hips and core to be at least 4/5 to improve stability and pain.     Time  6    Period  Weeks    Status  New    Target Date  06/17/17      PT LONG TERM GOAL #4   Title  Pt to report decreased pain in back and R LE to be rated 0-2/10 with activity.     Time  6    Period  Weeks    Target Date  06/17/17      PT LONG TERM GOAL #5   Title  Pt to demo ability for proper lift/squat technique to improve safety with work duties.     Time  6    Period  Weeks    Status  New    Target Date  06/17/17  Plan - 05/07/17 0845    Clinical Impression Statement  Pt presents wtih primary complaint of increased pain in low back, with intermittent symptoms into R LE. She has pain in central lumbar spine, with palpation of spinous process. She has minimal muscle tightness in lumbar and QL region, but does have soreness in R glute. She has ROM for LEs that is WNL, mild restriction for hip flextion due to pain, pain with SLR but no increased tingling with this. She has mid weakness in hips, and moderate weakness in core, with lack of effective HEP. She has poor ability for lumbar ROM, limited with flexion motion, and limited with extension due to pain. Pt with painful extension and repeated motions into extension today. Pt with decreased ability for full functional activities, due to pain. Pt to benefit from  skilled PT to improve pain and deficits, and to improve ability for work duties.     Clinical Presentation  Stable    Clinical Decision Making  Low    Rehab Potential  Good    PT Frequency  2x / week    PT Duration  6 weeks    PT Treatment/Interventions  ADLs/Self Care Home Management;Cryotherapy;Electrical Stimulation;Iontophoresis 4mg /ml Dexamethasone;Functional mobility training;Stair training;Gait training;Ultrasound;Moist Heat;Therapeutic activities;Therapeutic exercise;Balance training;Neuromuscular re-education;Patient/family education;Passive range of motion;Manual techniques;Dry needling;Taping    Consulted and Agree with Plan of Care  Patient       Patient will benefit from skilled therapeutic intervention in order to improve the following deficits and impairments:  Decreased endurance, Hypomobility, Decreased activity tolerance, Decreased strength, Pain, Decreased mobility, Decreased range of motion, Improper body mechanics, Impaired flexibility  Visit Diagnosis: Acute right-sided low back pain with right-sided sciatica     Problem List Patient Active Problem List   Diagnosis Date Noted  . Vegan diet 11/08/2015  . Pre-menstrual syndrome 11/08/2015   Sedalia Muta, PT, DPT 11:51 AM  05/07/17    [facility] [address] [place], [place], [address] Phone: [phone]   Fax:  [phone]  Name: KATHREEN DILEO MRN: [id number] Date of Birth: 22-Aug-1990

## 2017-05-08 ENCOUNTER — Encounter: Payer: Self-pay | Admitting: Physical Therapy

## 2017-05-08 ENCOUNTER — Ambulatory Visit: Payer: BLUE CROSS/BLUE SHIELD | Admitting: Physical Therapy

## 2017-05-08 DIAGNOSIS — M5441 Lumbago with sciatica, right side: Secondary | ICD-10-CM

## 2017-05-08 NOTE — Therapy (Signed)
[facility] [address] [place], [place], [address] Phone: [phone]   Fax:  [phone]  Physical Therapy Treatment  Patient Details  Name: Alison Moss MRN: [id number] Date of Birth: 04-30-1990 Referring Provider: Gaspar Bidding   Encounter Date: 05/08/2017  PT End of Session - 05/08/17 0938    Visit Number  2    Number of Visits  12    Date for PT Re-Evaluation  06/17/17    PT Start Time  0850    PT Stop Time  0925    PT Time Calculation (min)  35 min    Activity Tolerance  Patient tolerated treatment well;Patient limited by pain    Behavior During Therapy  [facility] for tasks assessed/performed       Past Medical History:  Diagnosis Date  . Anemia   . Asthma   . Breast cyst     Past Surgical History:  Procedure Laterality Date  . BREAST BIOPSY Left   . NO PAST SURGERIES      There were no vitals filed for this visit.  Subjective Assessment - 05/08/17 0937    Subjective  Pt states minimal pain this am, states pain is usually better in the morning. She worked and had a long day yesterday, did not have time to do stretches.     Currently in Pain?  Yes    Pain Score  3     Pain Location  Back    Pain Orientation  Lower    Pain Descriptors / Indicators  Aching;Dull    Pain Onset  1 to 4 weeks ago    Pain Frequency  Intermittent                       [facility] Adult PT Treatment/Exercise - 05/08/17 0852      Exercises   Exercises  Lumbar      Lumbar Exercises: Stretches   Single Knee to Chest Stretch  3 reps;30 seconds    Double Knee to Chest Stretch  2 reps;30 seconds    Figure 4 Stretch  3 reps;30 seconds    Figure 4 Stretch Limitations  seated    Other Lumbar Stretch Exercise  Cat/Camel x15; seated lumbar Flexion 30 sec x3 ;       Lumbar Exercises: Supine   Ab Set  15 reps    Pelvic Tilt  15 reps    Clam  20 reps    Clam Limitations  GTB with TA    Bent Knee Raise  20 reps    Bent Knee  Raise Limitations  with TA    Bridge  10 reps      Manual Therapy   Manual Therapy  Soft tissue mobilization;Manual Traction    Soft tissue mobilization  DTM to R glute with myofacial ball, with Hip IR/ER    Manual Traction  Long leg distraction on R 10 sec x6;              PT Education - 05/08/17 4132    Education provided  Yes    Education Details  HEP, TA contraction     Person(s) Educated  Patient    Methods  Explanation    Comprehension  Verbalized understanding       PT Short Term Goals - 05/07/17 0853      PT SHORT TERM GOAL #1   Title  Pt to be independent with initial HEP  Time  2    Period  Weeks    Status  New    Target Date  05/20/17      PT SHORT TERM GOAL #2   Title  Pt to report decreased pain to 4/10 with lumbar movement.     Time  2    Period  Weeks    Status  New    Target Date  05/20/17        PT Long Term Goals - 05/07/17 0855      PT LONG TERM GOAL #1   Title  Pt to be independent with final HEP     Time  6    Period  Weeks    Status  New    Target Date  06/17/17      PT LONG TERM GOAL #2   Title  Pt to demo improved lumbar ROM to be WNL and pain free, to improve ability for child care and IADLs.     Time  6    Period  Weeks    Status  New    Target Date  06/17/17      PT LONG TERM GOAL #3   Title  Pt to demo improved strength of hips and core to be at least 4/5 to improve stability and pain.     Time  6    Period  Weeks    Status  New    Target Date  06/17/17      PT LONG TERM GOAL #4   Title  Pt to report decreased pain in back and R LE to be rated 0-2/10 with activity.     Time  6    Period  Weeks    Target Date  06/17/17      PT LONG TERM GOAL #5   Title  Pt to demo ability for proper lift/squat technique to improve safety with work duties.     Time  6    Period  Weeks    Status  New    Target Date  06/17/17            Plan - 05/08/17 0939    Clinical Impression Statement  Pt with minimal soreness with  stretching and activities today, and no tingling sensation in LE elicited.  Able to start light core activation and strengthening without increased pain. Pt with tightness and tenderness deep in R glute, with maual today. Will continue to assess pain levels after todays session, and at different time of day, pt usually with increarsed pain in pm vs am. Plan to progress ROM and strength as toleated.     Rehab Potential  Good    PT Frequency  2x / week    PT Duration  6 weeks    PT Treatment/Interventions  ADLs/Self Care Home Management;Cryotherapy;Electrical Stimulation;Iontophoresis 4mg /ml Dexamethasone;Functional mobility training;Stair training;Gait training;Ultrasound;Moist Heat;Therapeutic activities;Therapeutic exercise;Balance training;Neuromuscular re-education;Patient/family education;Passive range of motion;Manual techniques;Dry needling;Taping    Consulted and Agree with Plan of Care  Patient       Patient will benefit from skilled therapeutic intervention in order to improve the following deficits and impairments:  Decreased endurance, Hypomobility, Decreased activity tolerance, Decreased strength, Pain, Decreased mobility, Decreased range of motion, Improper body mechanics, Impaired flexibility  Visit Diagnosis: Acute right-sided low back pain with right-sided sciatica     Problem List Patient Active Problem List   Diagnosis Date Noted  . Vegan diet 11/08/2015  . Pre-menstrual syndrome 11/08/2015   Sedalia Muta, PT,  DPT 11:57 AM  05/08/17    [facility] [address] [place], [place], [address] Phone: [phone]   Fax:  [phone]  Name: Alison Moss MRN: [id number] Date of Birth: Nov 17, 1990

## 2017-05-09 DIAGNOSIS — F331 Major depressive disorder, recurrent, moderate: Secondary | ICD-10-CM | POA: Diagnosis not present

## 2017-05-09 DIAGNOSIS — F411 Generalized anxiety disorder: Secondary | ICD-10-CM | POA: Diagnosis not present

## 2017-05-14 ENCOUNTER — Ambulatory Visit: Payer: BLUE CROSS/BLUE SHIELD | Admitting: Sports Medicine

## 2017-05-14 ENCOUNTER — Encounter: Payer: Self-pay | Admitting: Physical Therapy

## 2017-05-14 ENCOUNTER — Ambulatory Visit: Payer: BLUE CROSS/BLUE SHIELD | Admitting: Physical Therapy

## 2017-05-14 ENCOUNTER — Encounter: Payer: Self-pay | Admitting: Sports Medicine

## 2017-05-14 VITALS — BP 92/58 | HR 81 | Ht 65.0 in | Wt 121.8 lb

## 2017-05-14 DIAGNOSIS — M542 Cervicalgia: Secondary | ICD-10-CM

## 2017-05-14 DIAGNOSIS — M9905 Segmental and somatic dysfunction of pelvic region: Secondary | ICD-10-CM | POA: Diagnosis not present

## 2017-05-14 DIAGNOSIS — M9903 Segmental and somatic dysfunction of lumbar region: Secondary | ICD-10-CM

## 2017-05-14 DIAGNOSIS — M9904 Segmental and somatic dysfunction of sacral region: Secondary | ICD-10-CM | POA: Diagnosis not present

## 2017-05-14 DIAGNOSIS — M546 Pain in thoracic spine: Secondary | ICD-10-CM | POA: Diagnosis not present

## 2017-05-14 DIAGNOSIS — M5441 Lumbago with sciatica, right side: Secondary | ICD-10-CM

## 2017-05-14 DIAGNOSIS — M9908 Segmental and somatic dysfunction of rib cage: Secondary | ICD-10-CM

## 2017-05-14 DIAGNOSIS — M9901 Segmental and somatic dysfunction of cervical region: Secondary | ICD-10-CM

## 2017-05-14 DIAGNOSIS — M9902 Segmental and somatic dysfunction of thoracic region: Secondary | ICD-10-CM | POA: Diagnosis not present

## 2017-05-14 DIAGNOSIS — G8929 Other chronic pain: Secondary | ICD-10-CM

## 2017-05-14 MED ORDER — GABAPENTIN 100 MG PO CAPS: ORAL_CAPSULE | ORAL | 1 refills | Status: DC

## 2017-05-14 NOTE — Progress Notes (Signed)
Alison Moss. [facility] at [facility] [phone]  Alison Moss - 27 y.o. female MRN [id number]  Date of birth: 1990-05-17  Visit Date: 05/14/2017  PCP: Pearline Cables, MD   Referred by: Pearline Cables, MD  Scribe for today's visit: Christoper Fabian, LAT, ATC     SUBJECTIVE:  Alison Moss is here for Follow-up (Low back pain w/ R sciatica) .   Her back and R hip pain symptoms INITIALLY: Began Monday, April 28, 2017 and she feels that the symptoms may be due to "twisting her back" 2 weeks ago. Described as an 8/10 constant, aching pain, Worsened with movement  Additional associated symptoms include: R LE weakness    05/14/17: Compared to the last office visit on 04/30/17, her previously described back pain symptoms are improving w/ less pain w/ movement. Current symptoms are mild & are nonradiating.  She states that she does get some N/T in B LEs when sitting for prolonged periods of time but states that this has been going on for a while. She has been taking Gabapentin 300 mg bid and completed a medrol dose pack.  She is going to PT and feels that this is helping.  She is doing knees to chest, cat/camel, pelvic tilts, prone press-ups ...  ROS Denies night time disturbances. Denies fevers, chills, or night sweats. Denies unexplained weight loss. Denies personal history of cancer. Denies changes in bowel or bladder habits. Denies recent unreported falls. Denies new or worsening dyspnea or wheezing. Denies headaches or dizziness.  Denies numbness, tingling or weakness  In the extremities.  Reports dizziness or presyncopal episodes - some dizziness w/ the Gabapentin Denies lower extremity edema    HISTORY & PERTINENT PRIOR DATA:  Prior History reviewed and updated per electronic medical record.  Significant/pertinent history, findings, studies include:  reports that she has never smoked. She has never used smokeless  tobacco. No results for input(s): HGBA1C, LABURIC, CREATINE in the last 8760 hours. No specialty comments available. No problems updated.  OBJECTIVE:  VS:  HT:5\' 5"  (165.1 cm)   WT:121 lb 12.8 oz (55.2 kg)  BMI:20.27    BP:(Abnormal) 92/58  HR:81bpm  TEMP: ( )  RESP:96 %   PHYSICAL EXAM: Constitutional: WDWN, Non-toxic appearing. Psychiatric: Alert & appropriately interactive.  Not depressed or anxious appearing. Respiratory: No increased work of breathing.  Trachea Midline Eyes: Pupils are equal.  EOM intact without nystagmus.  No scleral icterus  Vascular Exam: warm to touch no edema  upper and lower extremity neuro exam: unremarkable  MSK Exam: Anterior head forward carriage.  She has good flexion-extension of her neck although with a forward tilt.  Negative Spurling's compression test Lhermitte's compression test.  Negative DBS testing.   ASSESSMENT & PLAN:   1. Neck pain   2. Somatic dysfunction of cervical region   3. Somatic dysfunction of thoracic region   4. Somatic dysfunction of lumbar region   5. Somatic dysfunction of pelvis region   6. Somatic dysfunction of rib cage region   7. Somatic dysfunction of sacral region   8. Chronic bilateral thoracic back pain     PLAN: Osteopathic manipulation was performed today based on physical exam findings.  Please see procedure note for further information including Osteopathic Exam findings  Plan refer to physical therapy  Gabapentin titration  Follow-up: Return in about 1 month (around 06/11/2017).      Please see additional documentation for Objective, Assessment  and Plan sections. Pertinent additional documentation may be included in corresponding procedure notes, imaging studies, problem based documentation and patient instructions. Please see these sections of the encounter for additional information regarding this visit.  CMA/ATC served as Neurosurgeon during this visit. History, Physical, and Plan performed by  medical provider. Documentation and orders reviewed and attested to.      Andrena Mews, DO    [facility] Physician

## 2017-05-14 NOTE — Patient Instructions (Signed)

## 2017-05-14 NOTE — Progress Notes (Signed)
PROCEDURE NOTE : OSTEOPATHIC MANIPULATION The decision today to treat with Osteopathic Manipulative Therapy (OMT) was based on physical exam findings. Verbal consent was obtained following a discussion with the patient regarding the of risks, benefits and potential side effects, including an acute pain flare,post manipulation soreness and need for repeat treatments.     Contraindications to OMT reviewed and include: NONE  Manipulation was performed as below: Regions treated: Cervical spine, Ribs, Thoracic spine, Lumbar spine, Pelvis and Upper extremities OMT Techniques Used: HVLA, muscle energy and myofascial release  The patient tolerated the treatment well and reported Improved symptoms following treatment today. Patient was given medications, exercises, stretches and lifestyle modifications per AVS and verbally.   OSTEOPATHIC/STRUCTURAL EXAM:   C2 through C4 FRS left C5 FRS right T2 extended side bent right T4 through T6 rotated left L3 FRS right Left on left sacral torsion Right anterior innominate

## 2017-05-15 DIAGNOSIS — F411 Generalized anxiety disorder: Secondary | ICD-10-CM | POA: Diagnosis not present

## 2017-05-15 DIAGNOSIS — F331 Major depressive disorder, recurrent, moderate: Secondary | ICD-10-CM | POA: Diagnosis not present

## 2017-05-15 NOTE — Therapy (Signed)
[facility] [address] [place], [place], [address] Phone: [phone]   Fax:  [phone]  Physical Therapy Treatment  Patient Details  Name: Alison Moss MRN: [id number] Date of Birth: 1990-07-12 Referring Provider: Gaspar Bidding   Encounter Date: 05/14/2017  PT End of Session - 05/14/17 1439    Visit Number  3    Number of Visits  12    Date for PT Re-Evaluation  06/17/17    PT Start Time  1433    PT Stop Time  1514    PT Time Calculation (min)  41 min    Activity Tolerance  Patient tolerated treatment well;Patient limited by pain    Behavior During Therapy  [facility] for tasks assessed/performed       Past Medical History:  Diagnosis Date  . Anemia   . Asthma   . Breast cyst     Past Surgical History:  Procedure Laterality Date  . BREAST BIOPSY Left   . NO PAST SURGERIES      There were no vitals filed for this visit.  Subjective Assessment - 05/14/17 1437    Subjective  Pt states much improvement in pain. Hasnt had to use heating pad in 2 days, only notices tingling when she is "curled up on couch", goes away with position change.     Currently in Pain?  No/denies    Pain Score  0-No pain                       [facility] Adult PT Treatment/Exercise - 05/14/17 1439      Exercises   Exercises  Lumbar      Lumbar Exercises: Stretches   Single Knee to Chest Stretch  --    Double Knee to Chest Stretch  --    Figure 4 Stretch  3 reps;30 seconds    Figure 4 Stretch Limitations  seated    Other Lumbar Stretch Exercise  Cat/Camel x15; seated lumbar Flexion 30 sec x3 ;       Lumbar Exercises: Aerobic   Stationary Bike  L2 x 8 min      Lumbar Exercises: Supine   Ab Set  --    Pelvic Tilt  --    Clam  20 reps    Clam Limitations  GTB with TA    Bent Knee Raise  20 reps    Bent Knee Raise Limitations  with TA    Bridge  --    Bridge with [employer]  20 reps    Straight Leg Raise  20 reps       Lumbar Exercises: Sidelying   Hip Abduction  10 reps;Both      Lumbar Exercises: Prone   Opposite Arm/Leg Raise  20 reps      Lumbar Exercises: Quadruped   Madcat/Old Horse  20 reps      Manual Therapy   Manual Therapy  Soft tissue mobilization;Manual Traction    Soft tissue mobilization  DTM to R glute with myofacial ball, with Hip IR/ER    Manual Traction  --             PT Education - 05/14/17 1438    Education provided  Yes    Education Details  HEP    Person(s) Educated  Patient    Methods  Explanation;Tactile cues    Comprehension  Verbalized understanding;Need further instruction       PT Short Term Goals -  05/15/17 0823      PT SHORT TERM GOAL #1   Title  Pt to be independent with initial HEP     Time  2    Period  Weeks    Status  Achieved      PT SHORT TERM GOAL #2   Title  Pt to report decreased pain to 4/10 with lumbar movement.     Time  2    Period  Weeks    Status  Achieved        PT Long Term Goals - 05/07/17 0855      PT LONG TERM GOAL #1   Title  Pt to be independent with final HEP     Time  6    Period  Weeks    Status  New    Target Date  06/17/17      PT LONG TERM GOAL #2   Title  Pt to demo improved lumbar ROM to be WNL and pain free, to improve ability for child care and IADLs.     Time  6    Period  Weeks    Status  New    Target Date  06/17/17      PT LONG TERM GOAL #3   Title  Pt to demo improved strength of hips and core to be at least 4/5 to improve stability and pain.     Time  6    Period  Weeks    Status  New    Target Date  06/17/17      PT LONG TERM GOAL #4   Title  Pt to report decreased pain in back and R LE to be rated 0-2/10 with activity.     Time  6    Period  Weeks    Target Date  06/17/17      PT LONG TERM GOAL #5   Title  Pt to demo ability for proper lift/squat technique to improve safety with work duties.     Time  6    Period  Weeks    Status  New    Target Date  06/17/17             Plan - 05/15/17 0825    Clinical Impression Statement  Pt with significant improvement of pain in lumbar region, no tenderness to palpate today. She does continue to have noted weakness in core, hip, glute, and will benefit from continued care for strengthening and education on HEP .     Rehab Potential  Good    PT Frequency  2x / week    PT Duration  6 weeks    PT Treatment/Interventions  ADLs/Self Care Home Management;Cryotherapy;Electrical Stimulation;Iontophoresis /ml Dexamethasone;Functional mobility training;Stair training;Gait training;Ultrasound;Moist Heat;Therapeutic activities;Therapeutic exercise;Balance training;Neuromuscular re-education;Patient/family education;Passive range of motion;Manual techniques;Dry needling;Taping    Consulted and Agree with Plan of Care  Patient       Patient will benefit from skilled therapeutic intervention in order to improve the following deficits and impairments:  Decreased endurance, Hypomobility, Decreased activity tolerance, Decreased strength, Pain, Decreased mobility, Decreased range of motion, Improper body mechanics, Impaired flexibility  Visit Diagnosis: Acute right-sided low back pain with right-sided sciatica     Problem List Patient Active Problem List   Diagnosis Date Noted  . Vegan diet 11/08/2015  . Pre-menstrual syndrome 11/08/2015   Sedalia Muta, PT, DPT 8:31 AM  05/15/17    [facility] [address] [place], [place], [address]  Phone: [phone]   Fax:  [phone]  Name: Alison Moss MRN: [id number] Date of Birth: Jul 15, 1990

## 2017-05-17 ENCOUNTER — Other Ambulatory Visit: Payer: Self-pay | Admitting: Family Medicine

## 2017-05-17 DIAGNOSIS — E559 Vitamin D deficiency, unspecified: Secondary | ICD-10-CM

## 2017-05-20 ENCOUNTER — Ambulatory Visit: Payer: BLUE CROSS/BLUE SHIELD | Admitting: Physical Therapy

## 2017-05-20 DIAGNOSIS — M5441 Lumbago with sciatica, right side: Secondary | ICD-10-CM | POA: Diagnosis not present

## 2017-05-21 ENCOUNTER — Encounter: Payer: Self-pay | Admitting: Physical Therapy

## 2017-05-21 NOTE — Therapy (Signed)
[facility] [address] [place], [place], [address] Phone: [phone]   Fax:  [phone]  Physical Therapy Treatment  Patient Details  Name: Alison Moss MRN: [id number] Date of Birth: 1990/02/09 Referring Provider: Gaspar Bidding   Encounter Date: 05/20/2017  PT End of Session - 05/21/17 1002    Visit Number  4    Number of Visits  12    Date for PT Re-Evaluation  06/17/17    PT Start Time  1602    PT Stop Time  1644    PT Time Calculation (min)  42 min    Activity Tolerance  Patient tolerated treatment well;Patient limited by pain    Behavior During Therapy  [facility] for tasks assessed/performed       Past Medical History:  Diagnosis Date  . Anemia   . Asthma   . Breast cyst     Past Surgical History:  Procedure Laterality Date  . BREAST BIOPSY Left   . NO PAST SURGERIES      There were no vitals filed for this visit.  Subjective Assessment - 05/21/17 1000    Subjective  Pt states continued improvements in back pain, had to sleep in different position (due to child sleeping in bed) and had mild discomfort in AM, but otherwise has felt improvements.     Currently in Pain?  No/denies    Pain Score  0-No pain                       [facility] Adult PT Treatment/Exercise - 05/20/17 1609      Exercises   Exercises  Lumbar      Lumbar Exercises: Stretches   Figure 4 Stretch  3 reps;30 seconds    Figure 4 Stretch Limitations  seated    Other Lumbar Stretch Exercise  Cat/Camel x25; seated lumbar Flexion 30 sec x3 ;       Lumbar Exercises: Aerobic   Stationary Bike  L2 x 8 min      Lumbar Exercises: Seated   Hip Flexion on Ball  20 reps    Other Seated Lumbar Exercises  Rows GTB x30 seated on P Ball      Lumbar Exercises: Supine   Clam  20 reps    Clam Limitations  GTB with TA    Bent Knee Raise  --    Bent Knee Raise Limitations  --    Bridge with [employer]  20 reps    Straight Leg Raise  20 reps       Lumbar Exercises: Sidelying   Clam  15 reps;Both    Hip Abduction  --      Lumbar Exercises: Prone   Opposite Arm/Leg Raise  20 reps      Lumbar Exercises: Quadruped   Madcat/Old Horse  20 reps    Opposite Arm/Leg Raise  20 reps    Opposite Arm/Leg Raise Limitations  on PBall      Manual Therapy   Manual Therapy  Soft tissue mobilization;Manual Traction    Soft tissue mobilization  --             PT Education - 05/21/17 1001    Education provided  Yes    Education Details  HEP,     Person(s) Educated  Patient    Methods  Explanation    Comprehension  Verbalized understanding       PT Short Term Goals - 05/15/17 [phone]  PT SHORT TERM GOAL #1   Title  Pt to be independent with initial HEP     Time  2    Period  Weeks    Status  Achieved      PT SHORT TERM GOAL #2   Title  Pt to report decreased pain to 4/10 with lumbar movement.     Time  2    Period  Weeks    Status  Achieved        PT Long Term Goals - 05/07/17 0855      PT LONG TERM GOAL #1   Title  Pt to be independent with final HEP     Time  6    Period  Weeks    Status  New    Target Date  06/17/17      PT LONG TERM GOAL #2   Title  Pt to demo improved lumbar ROM to be WNL and pain free, to improve ability for child care and IADLs.     Time  6    Period  Weeks    Status  New    Target Date  06/17/17      PT LONG TERM GOAL #3   Title  Pt to demo improved strength of hips and core to be at least 4/5 to improve stability and pain.     Time  6    Period  Weeks    Status  New    Target Date  06/17/17      PT LONG TERM GOAL #4   Title  Pt to report decreased pain in back and R LE to be rated 0-2/10 with activity.     Time  6    Period  Weeks    Target Date  06/17/17      PT LONG TERM GOAL #5   Title  Pt to demo ability for proper lift/squat technique to improve safety with work duties.     Time  6    Period  Weeks    Status  New    Target Date  06/17/17             Plan - 05/21/17 1003    Clinical Impression Statement  Pt showing improvements with pain. She is also improving with ability for core strengthening, with less postural changes seen today with stabilization. She will continue to benefit from Strengthening and education on HEP, for increased safety at work, plan to d/c in 1-2 weeks.     Rehab Potential  Good    PT Frequency  2x / week    PT Duration  6 weeks    PT Treatment/Interventions  ADLs/Self Care Home Management;Cryotherapy;Electrical Stimulation;Iontophoresis /ml Dexamethasone;Functional mobility training;Stair training;Gait training;Ultrasound;Moist Heat;Therapeutic activities;Therapeutic exercise;Balance training;Neuromuscular re-education;Patient/family education;Passive range of motion;Manual techniques;Dry needling;Taping    Consulted and Agree with Plan of Care  Patient       Patient will benefit from skilled therapeutic intervention in order to improve the following deficits and impairments:  Decreased endurance, Hypomobility, Decreased activity tolerance, Decreased strength, Pain, Decreased mobility, Decreased range of motion, Improper body mechanics, Impaired flexibility  Visit Diagnosis: Acute right-sided low back pain with right-sided sciatica     Problem List Patient Active Problem List   Diagnosis Date Noted  . Vegan diet 11/08/2015  . Pre-menstrual syndrome 11/08/2015    Sedalia Muta, PT, DPT 10:06 AM  05/21/17    [facility] [address] [place], [place], [address]  Phone: [phone]   Fax:  [phone]  Name: Alison Moss MRN: [id number] Date of Birth: Jul 15, 1990

## 2017-05-22 ENCOUNTER — Ambulatory Visit: Payer: BLUE CROSS/BLUE SHIELD | Admitting: Physical Therapy

## 2017-05-22 ENCOUNTER — Encounter: Payer: Self-pay | Admitting: Physical Therapy

## 2017-05-22 ENCOUNTER — Other Ambulatory Visit: Payer: Self-pay | Admitting: Sports Medicine

## 2017-05-22 DIAGNOSIS — M5441 Lumbago with sciatica, right side: Secondary | ICD-10-CM

## 2017-05-22 NOTE — Telephone Encounter (Signed)
Spoke with pt to verify rx dosing. She advised that she does not currently need a refill on Gabapentin 300 mg and she only takes it BID.

## 2017-05-23 DIAGNOSIS — F331 Major depressive disorder, recurrent, moderate: Secondary | ICD-10-CM | POA: Diagnosis not present

## 2017-05-23 DIAGNOSIS — F411 Generalized anxiety disorder: Secondary | ICD-10-CM | POA: Diagnosis not present

## 2017-05-25 NOTE — Therapy (Addendum)
[facility] [address] [place], [place], [address] Phone: [phone]   Fax:  [phone]  Physical Therapy Treatment/Discharge  Patient Details  Name: Alison Moss MRN: [id number] Date of Birth: 1990-05-03 Referring Provider: Teresa Coombs   Encounter Date: 05/22/2017  PT End of Session - 05/25/17 2130    Visit Number  5    Number of Visits  12    Date for PT Re-Evaluation  06/17/17    PT Start Time  1275    PT Stop Time  1557    PT Time Calculation (min)  42 min    Activity Tolerance  Patient tolerated treatment well;Patient limited by pain    Behavior During Therapy  [facility] for tasks assessed/performed       Past Medical History:  Diagnosis Date  . Anemia   . Asthma   . Breast cyst     Past Surgical History:  Procedure Laterality Date  . BREAST BIOPSY Left   . NO PAST SURGERIES      There were no vitals filed for this visit.  Subjective Assessment - 05/25/17 2140    Subjective  Pt states improved pain, has been pain free with regular home activities, has not done much lifting at work .     Currently in Pain?  No/denies    Pain Score  0-No pain                               PT Education - 05/25/17 2128    Education provided  Yes    Education Details  Final HEP importance of continuing.     Person(s) Educated  Patient    Methods  Explanation    Comprehension  Verbalized understanding       PT Short Term Goals - 05/15/17 1700      PT SHORT TERM GOAL #1   Title  Pt to be independent with initial HEP     Time  2    Period  Weeks    Status  Achieved      PT SHORT TERM GOAL #2   Title  Pt to report decreased pain to 4/10 with lumbar movement.     Time  2    Period  Weeks    Status  Achieved        PT Long Term Goals - 05/25/17 2143      PT LONG TERM GOAL #1   Title  Pt to be independent with final HEP     Time  6    Period  Weeks    Status  Achieved      PT LONG TERM  GOAL #2   Title  Pt to demo improved lumbar ROM to be WNL and pain free, to improve ability for child care and IADLs.     Time  6    Period  Weeks    Status  Achieved      PT LONG TERM GOAL #3   Title  Pt to demo improved strength of hips and core to be at least 4/5 to improve stability and pain.     Time  6    Period  Weeks    Status  Partially Met      PT LONG TERM GOAL #4   Title  Pt to report decreased pain in back and R LE to be rated 0-2/10 with activity.  Time  6    Period  Weeks    Status  Achieved      PT LONG TERM GOAL #5   Title  Pt to demo ability for proper lift/squat technique to improve safety with work duties.     Time  6    Period  Weeks    Status  Achieved            Plan - 05/25/17 2144    Clinical Impression Statement  Pt showing good improvements in pain. Has been able to do all regular activities at home without pain. She demonstrates ability for proper squat/lift/carry technique for up to 15lb today. Pt with improve pain,and strength, as well as understanding of safety for back and posture. FInal HEP reviewed today, and its importance to continue. Pt having surgery in a few weeks. Discussed implications for exercise folling this and recommended that pt ask Dr. when it is safe to resume core activities. Pt ready for d/c to HEP. Pt in agreement with plan.     Rehab Potential  Good    PT Frequency  2x / week    PT Duration  6 weeks    PT Treatment/Interventions  ADLs/Self Care Home Management;Cryotherapy;Electrical Stimulation;Iontophoresis 28m/ml Dexamethasone;Functional mobility training;Stair training;Gait training;Ultrasound;Moist Heat;Therapeutic activities;Therapeutic exercise;Balance training;Neuromuscular re-education;Patient/family education;Passive range of motion;Manual techniques;Dry needling;Taping    Consulted and Agree with Plan of Care  Patient       Patient will benefit from skilled therapeutic intervention in order to improve the  following deficits and impairments:  Decreased endurance, Hypomobility, Decreased activity tolerance, Decreased strength, Pain, Decreased mobility, Decreased range of motion, Improper body mechanics, Impaired flexibility  Visit Diagnosis: Acute right-sided low back pain with right-sided sciatica     Problem List Patient Active Problem List   Diagnosis Date Noted  . Vegan diet 11/08/2015  . Pre-menstrual syndrome 11/08/2015   Alison Moss PT, DPT 9:48 PM  05/25/17    [facility] [place] [address]Phone: [phone]  Fax:  [phone] Name: Alison Moss: [id number] of Birth: 314-Sep-1992  PHYSICAL THERAPY DISCHARGE SUMMARY  Visits from Start of Care: 5   Plan: Patient agrees to discharge.  Patient goals were met. Patient is being discharged due to meeting the stated rehab goals.  ?????       Alison Moss PT, DPT 9:48 PM  05/25/17    <><><><><ADDENDUM: Pt will benefit from continuation of PT for one additional visit, to review final HEP and core strengthening exercises. Assessment and d/c for 05/22/17 visit were entered in error. Plan for d/c on 5/14 discussed with pt on 5/9.   Plan to d/c at next visit on 05/27/17. <><><><><   Alison Moss PT, DPT 4:52 PM  05/27/17

## 2017-05-27 ENCOUNTER — Ambulatory Visit: Payer: BLUE CROSS/BLUE SHIELD | Admitting: Physical Therapy

## 2017-05-27 ENCOUNTER — Encounter: Payer: Self-pay | Admitting: Physical Therapy

## 2017-05-27 DIAGNOSIS — M5441 Lumbago with sciatica, right side: Secondary | ICD-10-CM

## 2017-05-27 NOTE — Therapy (Signed)
[facility] [address] [place], [place], [address] Phone: [phone]   Fax:  [phone]  Physical Therapy Treatment  Patient Details  Name: Alison Moss MRN: [id number] Date of Birth: 1990-02-10 Referring Provider: Teresa Coombs   Encounter Date: 05/27/2017  PT End of Session - 05/27/17 1654    Visit Number  6    Number of Visits  12    Date for PT Re-Evaluation  06/17/17    PT Start Time  1604    PT Stop Time  1644    PT Time Calculation (min)  40 min    Activity Tolerance  Patient tolerated treatment well;Patient limited by pain    Behavior During Therapy  [facility] for tasks assessed/performed       Past Medical History:  Diagnosis Date  . Anemia   . Asthma   . Breast cyst     Past Surgical History:  Procedure Laterality Date  . BREAST BIOPSY Left   . NO PAST SURGERIES      There were no vitals filed for this visit.  Subjective Assessment - 05/27/17 1653    Subjective  Pt states that pain is improved, she has not felt any pain in back in last few days.     Currently in Pain?  No/denies         [facility] PT Assessment - 05/27/17 0001      AROM   Overall AROM Comments  WNL      Strength   Overall Strength Comments  WNL                   [facility] Adult PT Treatment/Exercise - 05/27/17 1657      Lumbar Exercises: Stretches   Figure 4 Stretch  3 reps;30 seconds    Figure 4 Stretch Limitations  supine    Other Lumbar Stretch Exercise  Cat/Camel x25;       Lumbar Exercises: Aerobic   Stationary Bike  L2 x 8 min      Lumbar Exercises: Standing   Other Standing Lumbar Exercises  Hip Abd and Ext YTB 2x10 each bil;       Lumbar Exercises: Supine   Clam  20 reps    Clam Limitations  GTB with TA    Bridge with [employer]  20 reps    Straight Leg Raise  20 reps      Lumbar Exercises: Sidelying   Clam  Both;15 reps    Hip Abduction  10 reps;Both      Lumbar Exercises: Prone   Opposite Arm/Leg  Raise  20 reps      Lumbar Exercises: Quadruped   Madcat/Old Horse  20 reps             PT Education - 05/27/17 1653    Education provided  Yes    Education Details  Final HEP reviewed in detail today, with education on slow progression after pt's surgery in 2 weeks.     Person(s) Educated  Patient    Methods  Explanation;Demonstration;Verbal cues    Comprehension  Verbalized understanding;Returned demonstration       PT Short Term Goals - 05/15/17 0823      PT SHORT TERM GOAL #1   Title  Pt to be independent with initial HEP     Time  2    Period  Weeks    Status  Achieved      PT SHORT TERM GOAL #2  Title  Pt to report decreased pain to 4/10 with lumbar movement.     Time  2    Period  Weeks    Status  Achieved        PT Long Term Goals - 05/27/17 1654      PT LONG TERM GOAL #1   Title  Pt to be independent with final HEP     Time  6    Period  Weeks    Status  Achieved      PT LONG TERM GOAL #2   Title  Pt to demo improved lumbar ROM to be WNL and pain free, to improve ability for child care and IADLs.     Time  6    Period  Weeks    Status  Achieved      PT LONG TERM GOAL #3   Title  Pt to demo improved strength of hips and core to be at least 4/5 to improve stability and pain.     Time  6    Period  Weeks    Status  Achieved      PT LONG TERM GOAL #4   Title  Pt to report decreased pain in back and R LE to be rated 0-2/10 with activity.     Time  6    Period  Weeks    Status  Achieved      PT LONG TERM GOAL #5   Title  Pt to demo ability for proper lift/squat technique to improve safety with work duties.     Time  6    Period  Weeks    Status  Achieved            Plan - 05/27/17 1655    Clinical Impression Statement  Pt with no pain with deep palpation of lumbar spine today. She has shown improvement in core strength, with decreased postural changes when performing, and less verbal cueing needed to perform correctly. Pt will  continue to improve core and hip strength with HEP . Final HEP reviewed in detail today. Pt ready for d/c to HEP. Last note/d/c was entered in error.     Rehab Potential  Good    PT Frequency  2x / week    PT Duration  6 weeks    PT Treatment/Interventions  ADLs/Self Care Home Management;Cryotherapy;Electrical Stimulation;Iontophoresis 57m/ml Dexamethasone;Functional mobility training;Stair training;Gait training;Ultrasound;Moist Heat;Therapeutic activities;Therapeutic exercise;Balance training;Neuromuscular re-education;Patient/family education;Passive range of motion;Manual techniques;Dry needling;Taping    Consulted and Agree with Plan of Care  Patient       Patient will benefit from skilled therapeutic intervention in order to improve the following deficits and impairments:  Decreased endurance, Hypomobility, Decreased activity tolerance, Decreased strength, Pain, Decreased mobility, Decreased range of motion, Improper body mechanics, Impaired flexibility  Visit Diagnosis: Acute right-sided low back pain with right-sided sciatica     Problem List Patient Active Problem List   Diagnosis Date Noted  . Vegan diet 11/08/2015  . Pre-menstrual syndrome 11/08/2015   LLyndee Hensen PT, DPT 4:59 PM  05/27/17    [facility] [address]Phone: [phone]  Fax:  [phone] Name: Alison DOCKERYMRN: [id number] of Birth: [birth year] PHYSICAL THERAPY DISCHARGE SUMMARY  Visits from Start of Care: 6  Plan: Patient agrees to discharge.  Patient goals were met. Patient is being discharged due to meeting the stated rehab goals.  ?????     Tyneshia Stivers  Kayleen Memos, PT, DPT 4:59 PM  05/27/17

## 2017-05-30 DIAGNOSIS — F411 Generalized anxiety disorder: Secondary | ICD-10-CM | POA: Diagnosis not present

## 2017-05-30 DIAGNOSIS — F331 Major depressive disorder, recurrent, moderate: Secondary | ICD-10-CM | POA: Diagnosis not present

## 2017-06-04 ENCOUNTER — Ambulatory Visit: Payer: BLUE CROSS/BLUE SHIELD | Admitting: Sports Medicine

## 2017-06-04 ENCOUNTER — Encounter: Payer: Self-pay | Admitting: Sports Medicine

## 2017-06-04 VITALS — BP 98/64 | HR 98 | Ht 65.0 in | Wt 120.2 lb

## 2017-06-04 DIAGNOSIS — M9901 Segmental and somatic dysfunction of cervical region: Secondary | ICD-10-CM | POA: Diagnosis not present

## 2017-06-04 DIAGNOSIS — M9902 Segmental and somatic dysfunction of thoracic region: Secondary | ICD-10-CM | POA: Diagnosis not present

## 2017-06-04 DIAGNOSIS — M9908 Segmental and somatic dysfunction of rib cage: Secondary | ICD-10-CM

## 2017-06-04 DIAGNOSIS — M546 Pain in thoracic spine: Secondary | ICD-10-CM

## 2017-06-04 DIAGNOSIS — G8929 Other chronic pain: Secondary | ICD-10-CM

## 2017-06-04 DIAGNOSIS — M542 Cervicalgia: Secondary | ICD-10-CM

## 2017-06-04 NOTE — Progress Notes (Signed)
PROCEDURE NOTE : OSTEOPATHIC MANIPULATION The decision today to treat with Osteopathic Manipulative Therapy (OMT) was based on physical exam findings. Verbal consent was obtained following a discussion with the patient regarding the of risks, benefits and potential side effects, including an acute pain flare,post manipulation soreness and need for repeat treatments.     NONE  Manipulation was performed as below: Regions treated: Cervical spine, Ribs and Thoracic spine OMT Techniques Used: HVLA, muscle energy and myofascial release  The patient tolerated the treatment well and reported Improved symptoms following treatment today. Patient was given medications, exercises, stretches and lifestyle modifications per AVS and verbally.   OSTEOPATHIC/STRUCTURAL EXAM:   OA - rotated right C2 - C4 Neutral, Rotated right, Sidebent right C6 FRS left (Flexed, Rotated & Sidebent) T1 ERS right (Extended, Rotated & Sidebent) Rib 6 Right  Posterior

## 2017-06-04 NOTE — Progress Notes (Signed)
Alison Moss. [facility] [phone]  Alison Moss - 27 y.o. female MRN [id number]  Date of birth: 1990-05-04  Visit Date: 06/04/2017  PCP: Pearline Cables, MD   Referred by: Pearline Cables, MD  Scribe for today's visit: Christoper Fabian, LAT, ATC     SUBJECTIVE:  Alison Moss is here for Follow-up (Lumbar pain) .   Her back and R hip pain symptoms INITIALLY: Began Monday, April 28, 2017 and she feels that the symptoms may be due to "twisting her back" 2 weeks ago. Described as an 8/10 constant, aching pain, Worsened with movement  Additional associated symptoms include: R LE weakness    05/14/17: Compared to the last office visit on 04/30/17, her previously described back pain symptoms are improving w/ less pain w/ movement. Current symptoms are mild & are nonradiating.  She states that she does get some N/T in B LEs when sitting for prolonged periods of time but states that this has been going on for a while. She has been taking Gabapentin 300 mg bid and completed a medrol dose pack.  She is going to PT and feels that this is helping.  She is doing knees to chest, cat/camel, pelvic tilts, prone press-ups ...  06/04/17: Compared to the last office visit on 05/14/17, her previously described lumbar pain symptoms are improving, noting that she is relatively pain-free. Current symptoms are mild & are nonradiating She has been taking Gabapentin 300 mg at night and has been going to PT w/ Lauren and was discharged last week.  She con't to do her HEP through PT but has not been doing the [employer].  ROS Denies night time disturbances. Denies fevers, chills, or night sweats. Denies unexplained weight loss. Denies personal history of cancer. Denies changes in bowel or bladder habits. Denies recent unreported falls. Denies new or worsening dyspnea or wheezing. Denies headaches or dizziness.  Denies numbness,  tingling or weakness  In the extremities.  Denies dizziness or presyncopal episodes Denies lower extremity edema    HISTORY & PERTINENT PRIOR DATA:  Prior History reviewed and updated per electronic medical record.  Significant/pertinent history, findings, studies include:  reports that she has never smoked. She has never used smokeless tobacco. No results for input(s): HGBA1C, LABURIC, CREATINE in the last 8760 hours. No specialty comments available. No problems updated.  OBJECTIVE:  VS:  HT:5\' 5"  (165.1 cm)   WT:120 lb 3.2 oz (54.5 kg)  BMI:20    BP:98/64  HR:98bpm  TEMP: ( )  RESP:97 %   PHYSICAL EXAM: Constitutional: WDWN, Non-toxic appearing. Psychiatric: Alert & appropriately interactive.  Not depressed or anxious appearing. Respiratory: No increased work of breathing.  Trachea Midline Eyes: Pupils are equal.  EOM intact without nystagmus.  No scleral icterus  Vascular Exam: warm to touch no edema  upper extremity neuro exam: unremarkable normal strength normal sensation normal reflexes  MSK Exam: Anterior chain dominance.  She has normal upper extremity strength.  Straightening of cervical lordosis.  Negative Spurling's compression test Lhermitte's compression test.   ASSESSMENT & PLAN:   1. Somatic dysfunction of cervical region   2. Somatic dysfunction of thoracic region   3. Somatic dysfunction of rib cage region   4. Neck pain   5. Chronic bilateral thoracic back pain     PLAN: Osteopathic manipulation was performed today based on physical exam findings.  Please see procedure note for further information including  Osteopathic Exam findings  Follow-up: Return if symptoms worsen or fail to improve.       Please see additional documentation for Objective, Assessment and Plan sections. Pertinent additional documentation may be included in corresponding procedure notes, imaging studies, problem based documentation and patient instructions. Please see  these sections of the encounter for additional information regarding this visit.  CMA/ATC served as Neurosurgeon during this visit. History, Physical, and Plan performed by medical provider. Documentation and orders reviewed and attested to.      Andrena Mews, DO    [facility] Physician

## 2017-06-05 DIAGNOSIS — F411 Generalized anxiety disorder: Secondary | ICD-10-CM | POA: Diagnosis not present

## 2017-06-05 DIAGNOSIS — F331 Major depressive disorder, recurrent, moderate: Secondary | ICD-10-CM | POA: Diagnosis not present

## 2017-06-12 DIAGNOSIS — F411 Generalized anxiety disorder: Secondary | ICD-10-CM | POA: Diagnosis not present

## 2017-06-12 DIAGNOSIS — F331 Major depressive disorder, recurrent, moderate: Secondary | ICD-10-CM | POA: Diagnosis not present

## 2017-06-15 ENCOUNTER — Encounter: Payer: Self-pay | Admitting: Sports Medicine

## 2017-06-19 DIAGNOSIS — F331 Major depressive disorder, recurrent, moderate: Secondary | ICD-10-CM | POA: Diagnosis not present

## 2017-06-19 DIAGNOSIS — F411 Generalized anxiety disorder: Secondary | ICD-10-CM | POA: Diagnosis not present

## 2017-06-26 DIAGNOSIS — F331 Major depressive disorder, recurrent, moderate: Secondary | ICD-10-CM | POA: Diagnosis not present

## 2017-06-26 DIAGNOSIS — F411 Generalized anxiety disorder: Secondary | ICD-10-CM | POA: Diagnosis not present

## 2017-07-10 DIAGNOSIS — F411 Generalized anxiety disorder: Secondary | ICD-10-CM | POA: Diagnosis not present

## 2017-07-10 DIAGNOSIS — F331 Major depressive disorder, recurrent, moderate: Secondary | ICD-10-CM | POA: Diagnosis not present

## 2017-07-24 DIAGNOSIS — F411 Generalized anxiety disorder: Secondary | ICD-10-CM | POA: Diagnosis not present

## 2017-07-24 DIAGNOSIS — F331 Major depressive disorder, recurrent, moderate: Secondary | ICD-10-CM | POA: Diagnosis not present

## 2017-07-31 DIAGNOSIS — F331 Major depressive disorder, recurrent, moderate: Secondary | ICD-10-CM | POA: Diagnosis not present

## 2017-07-31 DIAGNOSIS — F411 Generalized anxiety disorder: Secondary | ICD-10-CM | POA: Diagnosis not present

## 2017-08-04 ENCOUNTER — Ambulatory Visit: Payer: BLUE CROSS/BLUE SHIELD | Admitting: Medical

## 2017-08-04 ENCOUNTER — Encounter: Payer: Self-pay | Admitting: Medical

## 2017-08-04 ENCOUNTER — Other Ambulatory Visit (HOSPITAL_COMMUNITY)
Admission: RE | Admit: 2017-08-04 | Discharge: 2017-08-04 | Disposition: A | Discharge status: Home / Self Care | Payer: BLUE CROSS/BLUE SHIELD | Source: Ambulatory Visit | Attending: Medical | Admitting: Medical

## 2017-08-04 VITALS — BP 114/67 | HR 67 | Temp 98.1°F | Resp 16 | Ht 65.0 in | Wt 117.8 lb

## 2017-08-04 DIAGNOSIS — F419 Anxiety disorder, unspecified: Secondary | ICD-10-CM | POA: Diagnosis not present

## 2017-08-04 DIAGNOSIS — R5383 Other fatigue: Secondary | ICD-10-CM

## 2017-08-04 DIAGNOSIS — Z113 Encounter for screening for infections with a predominantly sexual mode of transmission: Secondary | ICD-10-CM | POA: Insufficient documentation

## 2017-08-04 DIAGNOSIS — R102 Pelvic and perineal pain: Secondary | ICD-10-CM | POA: Diagnosis not present

## 2017-08-04 DIAGNOSIS — R195 Other fecal abnormalities: Secondary | ICD-10-CM | POA: Diagnosis not present

## 2017-08-04 DIAGNOSIS — R110 Nausea: Secondary | ICD-10-CM | POA: Diagnosis not present

## 2017-08-04 DIAGNOSIS — R7989 Other specified abnormal findings of blood chemistry: Secondary | ICD-10-CM | POA: Diagnosis not present

## 2017-08-04 DIAGNOSIS — E559 Vitamin D deficiency, unspecified: Secondary | ICD-10-CM | POA: Diagnosis not present

## 2017-08-04 LAB — CBC WITH DIFFERENTIAL/PLATELET
BASOS ABS: 0 10*3/uL (ref 0.0–0.1)
Basophils Relative: 0.5 % (ref 0.0–3.0)
Eosinophils Absolute: 0 10*3/uL (ref 0.0–0.7)
Eosinophils Relative: 0.3 % (ref 0.0–5.0)
HEMATOCRIT: 37.9 % (ref 36.0–46.0)
Hemoglobin: 12.4 g/dL (ref 12.0–15.0)
LYMPHS PCT: 18.8 % (ref 12.0–46.0)
Lymphs Abs: 1.3 10*3/uL (ref 0.7–4.0)
MCHC: 32.7 g/dL (ref 30.0–36.0)
MCV: 91.1 fl (ref 78.0–100.0)
MONO ABS: 0.2 10*3/uL (ref 0.1–1.0)
Monocytes Relative: 3.4 % (ref 3.0–12.0)
NEUTROS ABS: 5.2 10*3/uL (ref 1.4–7.7)
NEUTROS PCT: 77 % (ref 43.0–77.0)
PLATELETS: 230 10*3/uL (ref 150.0–400.0)
RBC: 4.17 Mil/uL (ref 3.87–5.11)
RDW: 14 % (ref 11.5–15.5)
WBC: 6.7 10*3/uL (ref 4.0–10.5)

## 2017-08-04 LAB — POC URINALSYSI DIPSTICK (AUTOMATED)
BILIRUBIN UA: NEGATIVE
GLUCOSE UA: NEGATIVE
Ketones, UA: NEGATIVE
Leukocytes, UA: NEGATIVE
Nitrite, UA: NEGATIVE
Protein, UA: NEGATIVE
Urobilinogen, UA: NEGATIVE E.U./dL — AB
pH, UA: 5 (ref 5.0–8.0)

## 2017-08-04 LAB — COMPREHENSIVE METABOLIC PANEL
ALBUMIN: 4.8 g/dL (ref 3.5–5.2)
ALT: 10 U/L (ref 0–35)
AST: 11 U/L (ref 0–37)
Alkaline Phosphatase: 78 U/L (ref 39–117)
BUN: 5 mg/dL — AB (ref 6–23)
CHLORIDE: 104 meq/L (ref 96–112)
CO2: 27 meq/L (ref 19–32)
CREATININE: 0.65 mg/dL (ref 0.40–1.20)
Calcium: 9.7 mg/dL (ref 8.4–10.5)
GFR: 115.93 mL/min (ref >60.00)
GLUCOSE: 94 mg/dL (ref 70–99)
POTASSIUM: 3.7 meq/L (ref 3.5–5.1)
SODIUM: 140 meq/L (ref 135–145)
Total Bilirubin: 1.5 mg/dL — ABNORMAL HIGH (ref 0.2–1.2)
Total Protein: 7.6 g/dL (ref 6.0–8.3)

## 2017-08-04 LAB — VITAMIN B12: Vitamin B-12: 461 pg/mL (ref 211–911)

## 2017-08-04 LAB — VITAMIN D 25 HYDROXY (VIT D DEFICIENCY, FRACTURES): VITD: 16.22 ng/mL — AB (ref 30.00–100.00)

## 2017-08-04 LAB — TSH: TSH: 2.27 u[IU]/mL (ref 0.35–4.50)

## 2017-08-04 MED ORDER — CLONAZEPAM 0.5 MG PO TABS: 0.5000 mg | ORAL_TABLET | Freq: Two times a day (BID) | ORAL | 0 refills | Status: DC | PRN

## 2017-08-04 MED ORDER — CLONAZEPAM 0.5 MG PO TABS
0.5000 mg | ORAL_TABLET | Freq: Two times a day (BID) | ORAL | 0 refills | Status: DC | PRN
Start: 2017-08-04 — End: 2017-08-04

## 2017-08-04 MED ORDER — ONDANSETRON 8 MG PO TBDP: 8.0000 mg | ORAL_TABLET | Freq: Three times a day (TID) | ORAL | 0 refills | Status: DC | PRN

## 2017-08-04 MED ORDER — SERTRALINE HCL 25 MG PO TABS: 25.0000 mg | ORAL_TABLET | Freq: Every day | ORAL | 0 refills | Status: DC

## 2017-08-04 NOTE — Patient Instructions (Signed)
For your history of recent loose stools, I think this represents probable mild viral illness.  A loose stool number is at max twice a day.  So I do not think it would be beneficial she will to try to get stool panel studies.  Would recommend bland diet guidelines and avoid any greasy or fried foods.  If loose stools increase past 3 a day then please notify me and I would place order for stool panel studies.  During the interim we will provide Zofran for nausea.  Note urine pregnancy test was negative today.  For recent moderate to severe anxiety, I did refill your former sertraline prescription.  This can help with generalized anxiety.  For more severe anxiety or panic attack, I prescribed clonazepam.  Prescription advisement given and try to limit use as advised.  For history of fatigue, I am getting CBC, CMP, TSH, B12, B1 and vitamin D level.  Also for screening STD, I placed HIV and RPR labs.  Also getting urine ancillary studies. In addition due to mild suprapubic region tenderness, I am getting urine culture.  Follow-up in 73month with PCP or as needed with myself.

## 2017-08-04 NOTE — Progress Notes (Signed)
Subjective:    Patient ID: Alison CokeMollie P Moss, female    DOB: 19-Jun-1990, 27 y.o.   MRN: [id number]  HPI   Pt in with some diarrhea since Friday. About 2 loose stools a day at most. No vomiting. But she is nausea. Pt does have upset stomach at times. Coworker had cholera. He is working half days. Does not described direct prolonged exposure to this person.  Lmp- Pt states last menses probably last week in June. Patient stopped nuvaring in may. She has been using condoms.  Pt also states recently stress out an anxious since January. Was managing anxiety in past but recent a lot of added stress out work and personal life. Pt has trying conservative measures and request short term med. She nearly had panic attack the other day going to work. In the past used some antidpressants. Tried sertaline with no known side effects.  She also request std screening.  In addition reports moderate to severe fatigue. She does not hx of low b12 and low vit D. But also works full time and mom.    Review of Systems  Constitutional: Positive for fatigue. Negative for chills and fever.  HENT: Negative for congestion and drooling.   Respiratory: Negative for chest tightness, shortness of breath and wheezing.   Cardiovascular: Negative for chest pain and palpitations.  Gastrointestinal: Positive for nausea. Negative for abdominal pain and blood in stool.       Loose stools.  Musculoskeletal: Negative for back pain.  Skin: Negative for rash.  Neurological: Negative for dizziness, seizures, syncope, weakness and light-headedness.  Hematological: Negative for adenopathy. Does not bruise/bleed easily.  Psychiatric/Behavioral: Negative for behavioral problems, confusion, dysphoric mood and sleep disturbance. The patient is nervous/anxious.     Past Medical History:  Diagnosis Date  . Anemia   . Asthma   . Breast cyst      Social History   Socioeconomic History  . Marital status: Married    Spouse name:  Not on file  . Number of children: Not on file  . Years of education: Not on file  . Highest education level: Not on file  Occupational History  . Not on file  Social Needs  . Financial resource strain: Not on file  . Food insecurity:    Worry: Not on file    Inability: Not on file  . Transportation needs:    Medical: Not on file    Non-medical: Not on file  Tobacco Use  . Smoking status: Never Smoker  . Smokeless tobacco: Never Used  Substance and Sexual Activity  . Alcohol use: No  . Drug use: No  . Sexual activity: Yes    Birth control/protection: None  Lifestyle  . Physical activity:    Days per week: Not on file    Minutes per session: Not on file  . Stress: Not on file  Relationships  . Social connections:    Talks on phone: Not on file    Gets together: Not on file    Attends religious service: Not on file    Active member of club or organization: Not on file    Attends meetings of clubs or organizations: Not on file    Relationship status: Not on file  . Intimate partner violence:    Fear of current or ex partner: Not on file    Emotionally abused: Not on file    Physically abused: Not on file    Forced sexual activity: Not on file  Other Topics Concern  . Not on file  Social History Narrative  . Not on file    Past Surgical History:  Procedure Laterality Date  . BREAST BIOPSY Left   . NO PAST SURGERIES      Family History  Problem Relation Age of Onset  . Multiple sclerosis Mother     Allergies  Allergen Reactions  . Iodine Hives  . Shellfish Allergy Hives    Current Outpatient Medications on File Prior to Visit  Medication Sig Dispense Refill  . traZODone (DESYREL) 50 MG tablet Take 0.5-1 tablets (25-50 mg total) by mouth at bedtime as needed for sleep. 90 tablet 3  . gabapentin (NEURONTIN) 100 MG capsule 1 -2 tab po bid (Patient not taking: Reported on 08/04/2017) 90 capsule 1  . gabapentin (NEURONTIN) 300 MG capsule Start with 1 tab po qhs  X 1 week, then increase to 1 tab po bid X 1 week then 1 tab po tid prn (Patient not taking: Reported on 08/04/2017) 90 capsule 1  . NUVARING 0.12-0.015 MG/24HR vaginal ring INSERT 1 RING AS DIRECTED AND CHANGE MONTHLY  4   No current facility-administered medications on file prior to visit.     BP 114/67   Pulse 67   Temp 98.1 F (36.7 C) (Oral)   Resp 16   Ht 5\' 5"  (1.651 m)   Wt 117 lb 12.8 oz (53.4 kg)   SpO2 100%   BMI 19.60 kg/m       Objective:   Physical Exam  General Mental Status- Alert. General Appearance- Not in acute distress.   Skin General: Color- Normal Color. Moisture- Normal Moisture.  Neck Carotid Arteries- Normal color. Moisture- Normal Moisture. No carotid bruits. No JVD.  Chest and Lung Exam Auscultation: Breath Sounds:-Normal.  Cardiovascular Auscultation:Rythm- Regular. Murmurs & Other Heart Sounds:Auscultation of the heart reveals- No Murmurs.  Abdomen Inspection:-Inspeection Normal. Palpation/Percussion:Note:No mass. Palpation and Percussion of the abdomen reveal- Non Tender, Non Distended + BS, no rebound or guarding. Faint suprapubic tenderness only.    Neurologic Cranial Nerve exam:- CN III-XII intact(No nystagmus), symmetric smile. Strength:- 5/5 equal and symmetric strength both upper and lower extremities.  Back- no cva tenderness.    Assessment & Plan:  For your history of recent loose stools, I think this represents probable mild viral illness.  A loose stool number is at max twice a day.  So I do not think it would be beneficial she will to try to get stool panel studies.  Would recommend bland diet guidelines and avoid any greasy or fried foods.  If loose stools increase past 3 a day then please notify me and I would place order for stool panel studies.  During the interim we will provide Zofran for nausea.  Note urine pregnancy test was negative today.  For recent moderate to severe anxiety, I did refill your former sertraline  prescription.  This can help with generalized anxiety.  For more severe anxiety or panic attack, I prescribed clonazepam.  Prescription advisement given and try to limit use as advised.  For history of fatigue, I am getting CBC, CMP, TSH, B12, B1 and vitamin D level.  Also for screening STD, I placed HIV and RPR labs.  Also getting urine ancillary studies. In addition due to mild suprapubic region tenderness, I am getting urine culture.  Follow-up in 68month with PCP or as needed with myself.  Esperanza Richters, PA-C

## 2017-08-05 ENCOUNTER — Telehealth: Payer: Self-pay | Admitting: Medical

## 2017-08-05 LAB — URINE CYTOLOGY ANCILLARY ONLY
Chlamydia: NEGATIVE
Neisseria Gonorrhea: NEGATIVE
Trichomonas: NEGATIVE

## 2017-08-05 MED ORDER — VITAMIN D (ERGOCALCIFEROL) 1.25 MG (50000 UNIT) PO CAPS: 50000.0000 [IU] | ORAL_CAPSULE | ORAL | 0 refills | Status: DC

## 2017-08-05 NOTE — Telephone Encounter (Signed)
RX ergocalciferol sent to patient pharmacy. Advise repeat vit D in 8 weeks.

## 2017-08-07 DIAGNOSIS — F411 Generalized anxiety disorder: Secondary | ICD-10-CM | POA: Diagnosis not present

## 2017-08-07 DIAGNOSIS — F331 Major depressive disorder, recurrent, moderate: Secondary | ICD-10-CM | POA: Diagnosis not present

## 2017-08-07 LAB — URINE CULTURE
MICRO NUMBER: 90864431
SPECIMEN QUALITY:: ADEQUATE

## 2017-08-07 LAB — HIV ANTIBODY (ROUTINE TESTING W REFLEX): HIV 1&2 Ab, 4th Generation: NONREACTIVE

## 2017-08-07 LAB — VITAMIN B1: VITAMIN B1 (THIAMINE): 9 nmol/L (ref 8–30)

## 2017-08-07 LAB — RPR: RPR Ser Ql: NONREACTIVE

## 2017-08-14 DIAGNOSIS — F331 Major depressive disorder, recurrent, moderate: Secondary | ICD-10-CM | POA: Diagnosis not present

## 2017-08-14 DIAGNOSIS — F411 Generalized anxiety disorder: Secondary | ICD-10-CM | POA: Diagnosis not present

## 2017-08-21 DIAGNOSIS — F411 Generalized anxiety disorder: Secondary | ICD-10-CM | POA: Diagnosis not present

## 2017-08-21 DIAGNOSIS — F331 Major depressive disorder, recurrent, moderate: Secondary | ICD-10-CM | POA: Diagnosis not present

## 2017-08-28 ENCOUNTER — Other Ambulatory Visit: Payer: Self-pay | Admitting: Medical

## 2017-08-28 DIAGNOSIS — F411 Generalized anxiety disorder: Secondary | ICD-10-CM | POA: Diagnosis not present

## 2017-08-28 DIAGNOSIS — F331 Major depressive disorder, recurrent, moderate: Secondary | ICD-10-CM | POA: Diagnosis not present

## 2017-09-05 NOTE — Progress Notes (Signed)
[facility] [address] [place], [place] [address] [phone]  Date:  09/08/2017   Name:  Alison Moss   DOB:  1990-08-06   MRN:  [id number]  PCP:  Pearline Cables, MD    Chief Complaint: Medication Follow Up (1 month follow up, stress, anxiety, increased fatugue with zoloft, stopped taking gabapentin)   History of Present Illness:  Alison Moss is a 27 y.o. very pleasant female patient who presents with the following:  Short term follow-up today Last seen here by Ramon Dredge in July at which time he re-started her sertraline/ zoloft at 25 mg. I had seen her in January of this year.  In July she had noted more stress and anxiety related to work and to family She feels like this is helping her some- she does take it at bedtime She will feel a bit tired during the day but otherwise she is tolerating well and thinks that her sx are improving She was suffering from depression and anxiety- she is doing better with her anxiety as well as her depression Her job is stressful, as is trying to potty train her 3.5 yo son  She felt like she slept well when she was on gabapentin 100 at bedtime - she was given this by Dr. Berline Chough for neck pain in May of this year.  However it also had the SE of helping her sleep well at night  No SI     She is not taking trazodone She has klonopin for emergencies/ panic attacks but does not use it on a general basis  She is using an OCP now as nuva ring was too expensive She declines a flu shot today    Patient Active Problem List   Diagnosis Date Noted  . Vegan diet 11/08/2015  . Pre-menstrual syndrome 11/08/2015    Past Medical History:  Diagnosis Date  . Anemia   . Asthma   . Breast cyst     Past Surgical History:  Procedure Laterality Date  . BREAST BIOPSY Left   . NO PAST SURGERIES      Social History   Tobacco Use  . Smoking status: Never Smoker  . Smokeless tobacco:  Never Used  Substance Use Topics  . Alcohol use: No  . Drug use: No    Family History  Problem Relation Age of Onset  . Multiple sclerosis Mother     Allergies  Allergen Reactions  . Iodine Hives  . Shellfish Allergy Hives    Medication list has been reviewed and updated.  Current Outpatient Medications on File Prior to Visit  Medication Sig Dispense Refill  . clonazePAM (KLONOPIN) 0.5 MG tablet Take 1 tablet (0.5 mg total) by mouth 2 (two) times daily as needed for anxiety. 10 tablet 0  . gabapentin (NEURONTIN) 100 MG capsule 1 -2 tab po bid 90 capsule 1  . Vitamin D, Ergocalciferol, (DRISDOL) 50000 units CAPS capsule Take 1 capsule (50,000 Units total) by mouth every 7 (seven) days. 8 capsule 0   No current facility-administered medications on file prior to visit.     Review of Systems:  As per HPI- otherwise negative.   Physical Examination: Vitals:   09/08/17 1029  BP: 110/60  Pulse: 74  Resp: 16  Temp: 98.1 F (36.7 C)  SpO2: 98%   Vitals:   09/08/17 1029  Weight: 119 lb (54 kg)  Height: 5\' 5"  (1.651 m)  Body mass index is 19.8 kg/m. Ideal Body Weight: Weight in (lb) to have BMI = 25: 149.9  GEN: WDWN, NAD, Non-toxic, A & O x 3, petite build, looks well  HEENT: Atraumatic, Normocephalic. Neck supple. No masses, No LAD. Ears and Nose: No external deformity. CV: RRR, No M/G/R. No JVD. No thrill. No extra heart sounds. PULM: CTA B, no wheezes, crackles, rhonchi. No retractions. No resp. distress. No accessory muscle use. EXTR: No c/c/e NEURO Normal gait.  PSYCH: Normally interactive. Conversant. Not depressed or anxious appearing.  Calm demeanor.    Assessment and Plan: Anxiety - Plan: sertraline (ZOLOFT) 25 MG tablet  Adjustment insomnia  Stress at home  Depression and anxiety Doing ok on sertraline- discussed going up on her dose if she does not continue to improve For the time being will add back 100 mg of gabapentin at bedtime- she has  this on hand No seizure history  Advised that med combo may increase drowsiness  She will update me in a few weeks and we can go up on her sertraline at that time if need be She declines a flu shot today   Signed Abbe AmsterdamJessica Candida Vetter, MD

## 2017-09-08 ENCOUNTER — Encounter: Payer: Self-pay | Admitting: Family Medicine

## 2017-09-08 ENCOUNTER — Ambulatory Visit (INDEPENDENT_AMBULATORY_CARE_PROVIDER_SITE_OTHER): Payer: BLUE CROSS/BLUE SHIELD | Admitting: Family Medicine

## 2017-09-08 VITALS — BP 110/60 | HR 74 | Temp 98.1°F | Resp 16 | Ht 65.0 in | Wt 119.0 lb

## 2017-09-08 DIAGNOSIS — F5102 Adjustment insomnia: Secondary | ICD-10-CM | POA: Diagnosis not present

## 2017-09-08 DIAGNOSIS — F419 Anxiety disorder, unspecified: Secondary | ICD-10-CM

## 2017-09-08 DIAGNOSIS — F439 Reaction to severe stress, unspecified: Secondary | ICD-10-CM | POA: Diagnosis not present

## 2017-09-08 MED ORDER — SERTRALINE HCL 25 MG PO TABS: ORAL_TABLET | ORAL | 3 refills | Status: DC

## 2017-09-08 NOTE — Patient Instructions (Addendum)
It was good to see you today!  You can add back 100 mg of gabapentin at bedtime for sleep You are on a low dose of sertraline- let me know if you would like to go up and we can certainly increase your dose  Best of luck with work and also potty training for your son!  Please let me know if you are not doing ok Otherwise let's plan to visit in 3-4 months to check on how you are doing

## 2017-09-11 DIAGNOSIS — F411 Generalized anxiety disorder: Secondary | ICD-10-CM | POA: Diagnosis not present

## 2017-09-11 DIAGNOSIS — F331 Major depressive disorder, recurrent, moderate: Secondary | ICD-10-CM | POA: Diagnosis not present

## 2017-09-25 DIAGNOSIS — F411 Generalized anxiety disorder: Secondary | ICD-10-CM | POA: Diagnosis not present

## 2017-09-25 DIAGNOSIS — F331 Major depressive disorder, recurrent, moderate: Secondary | ICD-10-CM | POA: Diagnosis not present

## 2017-10-09 DIAGNOSIS — F411 Generalized anxiety disorder: Secondary | ICD-10-CM | POA: Diagnosis not present

## 2017-10-09 DIAGNOSIS — F331 Major depressive disorder, recurrent, moderate: Secondary | ICD-10-CM | POA: Diagnosis not present

## 2017-10-16 DIAGNOSIS — F331 Major depressive disorder, recurrent, moderate: Secondary | ICD-10-CM | POA: Diagnosis not present

## 2017-10-16 DIAGNOSIS — F411 Generalized anxiety disorder: Secondary | ICD-10-CM | POA: Diagnosis not present

## 2017-10-23 DIAGNOSIS — F331 Major depressive disorder, recurrent, moderate: Secondary | ICD-10-CM | POA: Diagnosis not present

## 2017-10-23 DIAGNOSIS — F411 Generalized anxiety disorder: Secondary | ICD-10-CM | POA: Diagnosis not present

## 2017-10-30 DIAGNOSIS — F331 Major depressive disorder, recurrent, moderate: Secondary | ICD-10-CM | POA: Diagnosis not present

## 2017-10-30 DIAGNOSIS — F411 Generalized anxiety disorder: Secondary | ICD-10-CM | POA: Diagnosis not present

## 2017-11-06 DIAGNOSIS — F411 Generalized anxiety disorder: Secondary | ICD-10-CM | POA: Diagnosis not present

## 2017-11-06 DIAGNOSIS — F331 Major depressive disorder, recurrent, moderate: Secondary | ICD-10-CM | POA: Diagnosis not present

## 2017-11-13 DIAGNOSIS — F331 Major depressive disorder, recurrent, moderate: Secondary | ICD-10-CM | POA: Diagnosis not present

## 2017-11-13 DIAGNOSIS — F411 Generalized anxiety disorder: Secondary | ICD-10-CM | POA: Diagnosis not present

## 2017-11-20 DIAGNOSIS — F331 Major depressive disorder, recurrent, moderate: Secondary | ICD-10-CM | POA: Diagnosis not present

## 2017-11-20 DIAGNOSIS — F411 Generalized anxiety disorder: Secondary | ICD-10-CM | POA: Diagnosis not present

## 2017-12-04 DIAGNOSIS — F411 Generalized anxiety disorder: Secondary | ICD-10-CM | POA: Diagnosis not present

## 2017-12-04 DIAGNOSIS — F331 Major depressive disorder, recurrent, moderate: Secondary | ICD-10-CM | POA: Diagnosis not present

## 2017-12-08 ENCOUNTER — Encounter: Payer: Self-pay | Admitting: Medical

## 2017-12-08 ENCOUNTER — Ambulatory Visit (HOSPITAL_BASED_OUTPATIENT_CLINIC_OR_DEPARTMENT_OTHER)
Admission: RE | Admit: 2017-12-08 | Discharge: 2017-12-08 | Disposition: A | Discharge status: Home / Self Care | Payer: BLUE CROSS/BLUE SHIELD | Source: Ambulatory Visit | Attending: Medical | Admitting: Medical

## 2017-12-08 ENCOUNTER — Ambulatory Visit: Payer: BLUE CROSS/BLUE SHIELD | Admitting: Medical

## 2017-12-08 VITALS — BP 103/57 | HR 65 | Temp 98.2°F | Resp 16 | Ht 65.0 in | Wt 120.4 lb

## 2017-12-08 DIAGNOSIS — R0789 Other chest pain: Secondary | ICD-10-CM

## 2017-12-08 DIAGNOSIS — R059 Cough, unspecified: Secondary | ICD-10-CM

## 2017-12-08 DIAGNOSIS — R05 Cough: Secondary | ICD-10-CM | POA: Diagnosis not present

## 2017-12-08 DIAGNOSIS — R062 Wheezing: Secondary | ICD-10-CM | POA: Insufficient documentation

## 2017-12-08 DIAGNOSIS — R0600 Dyspnea, unspecified: Secondary | ICD-10-CM

## 2017-12-08 DIAGNOSIS — J40 Bronchitis, not specified as acute or chronic: Secondary | ICD-10-CM

## 2017-12-08 MED ORDER — AZITHROMYCIN 250 MG PO TABS: ORAL_TABLET | ORAL | 0 refills | Status: DC

## 2017-12-08 MED ORDER — PREDNISONE 10 MG PO TABS: ORAL_TABLET | ORAL | 0 refills | Status: DC

## 2017-12-08 MED ORDER — ALBUTEROL SULFATE HFA 108 (90 BASE) MCG/ACT IN AERS: 2.0000 | INHALATION_SPRAY | Freq: Four times a day (QID) | RESPIRATORY_TRACT | 2 refills | Status: DC | PRN

## 2017-12-08 MED ORDER — BENZONATATE 100 MG PO CAPS: 100.0000 mg | ORAL_CAPSULE | Freq: Three times a day (TID) | ORAL | 0 refills | Status: DC | PRN

## 2017-12-08 NOTE — Patient Instructions (Addendum)
You appear to have bronchitis. Rest hydrate and tylenol for fever. I am prescribing cough medicine benzonatate, and  azithromycin antibiotic. For your nasal congestion Rx Flonase  Please get chest x-ray today.  For recent wheezing and dyspnea, I am prescribing albuterol inhaler and 5-day taper dose of prednisone.  You describe some anterior chest wall pressure sensation since yesterday.  Your EKG done today appears  Normal sinus rythym  We will see how you respond to above treatment.  If your symptoms worsen or change let us know.  Any severe changes then recommend ED evaluation.  Differential diagnosis of your symptoms discussed today.    Follow up in 7-10 days or as needed

## 2017-12-08 NOTE — Progress Notes (Signed)
Subjective:    Patient ID: Alison Moss, female    DOB: 12-Jun-1990, 27 y.o.   MRN: [id number]  HPI  Pt in with nasal and chest congestion for about one week. She state some sinus pressure and productive cough. Gradually getting worse. She states when she got out theater yesterday and she felt like had severe cough and lungs hurt to breath. She states felt like needles in her lungs yesterday.   She states has been wheezing. Lungs feel tight.   Pt had asthma as child and when gets sick will wheeze.  LMP- on continuous birth control.  At times describes some intermittent chest pressure yesterday evening.    Review of Systems  HENT: Positive for congestion. Negative for ear pain, sinus pain and sneezing.   Respiratory: Positive for cough, chest tightness, shortness of breath and wheezing.   Cardiovascular: Negative for chest pain and palpitations.       Atypical chest pressure.  Gastrointestinal: Negative for abdominal pain and anal bleeding.  Genitourinary: Negative for difficulty urinating, dysuria and flank pain.  Musculoskeletal: Negative for back pain, joint swelling, neck pain and neck stiffness.  Skin: Negative for rash.  Neurological: Negative for dizziness, numbness and headaches.  Hematological: Negative for adenopathy. Does not bruise/bleed easily.  Psychiatric/Behavioral: Negative for behavioral problems and confusion. The patient is not nervous/anxious.    Past Medical History:  Diagnosis Date  . Anemia   . Asthma   . Breast cyst      Social History   Socioeconomic History  . Marital status: Married    Spouse name: Not on file  . Number of children: Not on file  . Years of education: Not on file  . Highest education level: Not on file  Occupational History  . Not on file  Social Needs  . Financial resource strain: Not on file  . Food insecurity:    Worry: Not on file    Inability: Not on file  . Transportation needs:    Medical: Not on file   Non-medical: Not on file  Tobacco Use  . Smoking status: Never Smoker  . Smokeless tobacco: Never Used  Substance and Sexual Activity  . Alcohol use: No  . Drug use: No  . Sexual activity: Yes    Birth control/protection: None  Lifestyle  . Physical activity:    Days per week: Not on file    Minutes per session: Not on file  . Stress: Not on file  Relationships  . Social connections:    Talks on phone: Not on file    Gets together: Not on file    Attends religious service: Not on file    Active member of club or organization: Not on file    Attends meetings of clubs or organizations: Not on file    Relationship status: Not on file  . Intimate partner violence:    Fear of current or ex partner: Not on file    Emotionally abused: Not on file    Physically abused: Not on file    Forced sexual activity: Not on file  Other Topics Concern  . Not on file  Social History Narrative  . Not on file    Past Surgical History:  Procedure Laterality Date  . BREAST BIOPSY Left   . NO PAST SURGERIES      Family History  Problem Relation Age of Onset  . Multiple sclerosis Mother     Allergies  Allergen Reactions  . Iodine  Hives  . Shellfish Allergy Hives    Current Outpatient Medications on File Prior to Visit  Medication Sig Dispense Refill  . clonazePAM (KLONOPIN) 0.5 MG tablet Take 1 tablet (0.5 mg total) by mouth 2 (two) times daily as needed for anxiety. 10 tablet 0  . gabapentin (NEURONTIN) 100 MG capsule 1 -2 tab po bid 90 capsule 1  . sertraline (ZOLOFT) 25 MG tablet TAKE 1 TABLET BY MOUTH EVERYDAY AT BEDTIME 90 tablet 3  . SPRINTEC 28 0.25-35 MG-MCG tablet TAKE 1 (ONE) TABLET DAILY, CONTINUOUS ACTIVE PILLS  1  . Vitamin D, Ergocalciferol, (DRISDOL) 50000 units CAPS capsule Take 1 capsule (50,000 Units total) by mouth every 7 (seven) days. 8 capsule 0   No current facility-administered medications on file prior to visit.     BP (!) 103/57   Pulse 65   Temp 98.2 F  (36.8 C) (Oral)   Resp 16   Ht 5\' 5"  (1.651 m)   Wt 120 lb 6.4 oz (54.6 kg)   SpO2 100%   BMI 20.04 kg/m       Objective:   Physical Exam  General  Mental Status - Alert. General Appearance - Well groomed. Not in acute distress.  Skin Rashes- No Rashes.  HEENT Head- Normal. Ear Auditory Canal - Left- Normal. Right - Normal.Tympanic Membrane- Left- Normal. Right- Normal. Eye Sclera/Conjunctiva- Left- Normal. Right- Normal. Nose & Sinuses Nasal Mucosa- Left-  Boggy and Congested. Right-  Boggy and  Congested.Bilateral maxillary and frontal sinus pressure. Mouth & Throat Lips: Upper Lip- Normal: no dryness, cracking, pallor, cyanosis, or vesicular eruption. Lower Lip-Normal: no dryness, cracking, pallor, cyanosis or vesicular eruption. Buccal Mucosa- Bilateral- No Aphthous ulcers. Oropharynx- No Discharge or Erythema. Tonsils: Characteristics- Bilateral- No Erythema or Congestion. Size/Enlargement- Bilateral- No enlargement. Discharge- bilateral-None.  Neck Neck- Supple. No Masses.   Chest and Lung Exam Auscultation: Breath Sounds:-Clear even and unlabored.  Cardiovascular Auscultation:Rythm- Regular, rate and rhythm. Murmurs & Other Heart Sounds:Ausculatation of the heart reveal- No Murmurs.  Lymphatic Head & Neck General Head & Neck Lymphatics: Bilateral: Description- No Localized lymphadenopathy.  Lower ext- calfs symmetric. No swelling and negaitve homans signs.       Assessment & Plan:  You appear to have bronchitis. Rest hydrate and tylenol for fever. I am prescribing cough medicine benzonatate, and a azithromycin antibiotic. For your nasal congestion Rx Flonase  Please get chest x-ray today.  For recent wheezing and dyspnea, I am prescribing albuterol inhaler and 5-day taper dose of prednisone.  You describe some anterior chest wall pressure sensation since yesterday.  Your EKG done today appears was normal.  We will see how you respond to above  treatment.  If your symptoms worsen or change let us know.  Any severe changes then recommend ED evaluation.  Differential diagnosis of your symptoms discussed today.    Follow up in 7-10 days or as needed

## 2017-12-18 DIAGNOSIS — F331 Major depressive disorder, recurrent, moderate: Secondary | ICD-10-CM | POA: Diagnosis not present

## 2017-12-18 DIAGNOSIS — F411 Generalized anxiety disorder: Secondary | ICD-10-CM | POA: Diagnosis not present

## 2017-12-20 NOTE — Progress Notes (Deleted)
[facility] [address] [place], [place] [address] [phone]  Date:  12/22/2017   Name:  Alison Moss   DOB:  1991-01-02   MRN:  [id number]  PCP:  Pearline Cables, MD    Chief Complaint: No chief complaint on file.   History of Present Illness:  Alison Moss is a 27 y.o. very pleasant female patient who presents with the following:  Periodic follow-up visit today History of anemia, depression and anxiety. From our last visit in August: Last seen here by Ramon Dredge in July at which time he re-started her sertraline/ zoloft at 25 mg.  In July she had noted more stress and anxiety related to work and to family She feels like this is helping her some- she does take it at bedtime She was suffering from depression and anxiety- she is doing better with her anxiety as well as her depression Her job is stressful, as is trying to potty train her 3.5 yo son She felt like she slept well when she was on gabapentin 100 at bedtime - she was given this by Dr. Berline Chough for neck pain in May of this year.  However it also had the SE of helping her sleep well at night  No SI   She is not taking trazodone She has klonopin for emergencies/ panic attacks but does not use it on a general basis /////////////////////////////// Doing ok on sertraline- discussed going up on her dose if she does not continue to improve For the time being will add back 100 mg of gabapentin at bedtime- she has this on hand No seizure history  Advised that med combo may increase drowsiness   Flu:  Patient Active Problem List   Diagnosis Date Noted  . Vegan diet 11/08/2015  . Pre-menstrual syndrome 11/08/2015    Past Medical History:  Diagnosis Date  . Anemia   . Asthma   . Breast cyst     Past Surgical History:  Procedure Laterality Date  . BREAST BIOPSY Left   . NO PAST SURGERIES      Social History   Tobacco Use  . Smoking status: Never  Smoker  . Smokeless tobacco: Never Used  Substance Use Topics  . Alcohol use: No  . Drug use: No    Family History  Problem Relation Age of Onset  . Multiple sclerosis Mother     Allergies  Allergen Reactions  . Iodine Hives  . Shellfish Allergy Hives    Medication list has been reviewed and updated.  Current Outpatient Medications on File Prior to Visit  Medication Sig Dispense Refill  . albuterol (PROVENTIL HFA;VENTOLIN HFA) 108 (90 Base) MCG/ACT inhaler Inhale 2 puffs into the lungs every 6 (six) hours as needed for wheezing or shortness of breath. 1 Inhaler 2  . azithromycin (ZITHROMAX) 250 MG tablet Take 2 tablets by mouth on day 1, followed by 1 tablet by mouth daily for 4 days. 6 tablet 0  . benzonatate (TESSALON) 100 MG capsule Take 1 capsule (100 mg total) by mouth 3 (three) times daily as needed for cough. 30 capsule 0  . clonazePAM (KLONOPIN) 0.5 MG tablet Take 1 tablet (0.5 mg total) by mouth 2 (two) times daily as needed for anxiety. 10 tablet 0  . gabapentin (NEURONTIN) 100 MG capsule 1 -2 tab po bid 90 capsule 1  . predniSONE (DELTASONE) 10 MG tablet 5 TAB PO DAY 1 4 TAB  PO DAY 2 3 TAB PO DAY 3 2 TAB PO DAY 4 1 TAB PO DAY 5 15 tablet 0  . sertraline (ZOLOFT) 25 MG tablet TAKE 1 TABLET BY MOUTH EVERYDAY AT BEDTIME 90 tablet 3  . SPRINTEC 28 0.25-35 MG-MCG tablet TAKE 1 (ONE) TABLET DAILY, CONTINUOUS ACTIVE PILLS  1  . Vitamin D, Ergocalciferol, (DRISDOL) 50000 units CAPS capsule Take 1 capsule (50,000 Units total) by mouth every 7 (seven) days. 8 capsule 0   No current facility-administered medications on file prior to visit.     Review of Systems:  As per HPI- otherwise negative.   Physical Examination: There were no vitals filed for this visit. There were no vitals filed for this visit. There is no height or weight on file to calculate BMI. Ideal Body Weight:    GEN: WDWN, NAD, Non-toxic, A & O x 3 HEENT: Atraumatic, Normocephalic. Neck supple. No  masses, No LAD. Ears and Nose: No external deformity. CV: RRR, No M/G/R. No JVD. No thrill. No extra heart sounds. PULM: CTA B, no wheezes, crackles, rhonchi. No retractions. No resp. distress. No accessory muscle use. ABD: S, NT, ND, +BS. No rebound. No HSM. EXTR: No c/c/e NEURO Normal gait.  PSYCH: Normally interactive. Conversant. Not depressed or anxious appearing.  Calm demeanor.    Assessment and Plan: ***  Signed Abbe AmsterdamJessica , MD

## 2017-12-22 ENCOUNTER — Ambulatory Visit: Payer: BLUE CROSS/BLUE SHIELD | Admitting: Family Medicine

## 2017-12-22 DIAGNOSIS — Z0289 Encounter for other administrative examinations: Secondary | ICD-10-CM

## 2017-12-23 ENCOUNTER — Ambulatory Visit: Payer: BLUE CROSS/BLUE SHIELD | Admitting: Sports Medicine

## 2017-12-23 ENCOUNTER — Encounter: Payer: Self-pay | Admitting: Family Medicine

## 2017-12-23 ENCOUNTER — Encounter: Payer: Self-pay | Admitting: Sports Medicine

## 2017-12-23 ENCOUNTER — Ambulatory Visit (INDEPENDENT_AMBULATORY_CARE_PROVIDER_SITE_OTHER): Payer: BLUE CROSS/BLUE SHIELD

## 2017-12-23 VITALS — BP 100/60 | HR 61 | Ht 65.0 in | Wt 123.2 lb

## 2017-12-23 DIAGNOSIS — M9903 Segmental and somatic dysfunction of lumbar region: Secondary | ICD-10-CM | POA: Diagnosis not present

## 2017-12-23 DIAGNOSIS — M9904 Segmental and somatic dysfunction of sacral region: Secondary | ICD-10-CM

## 2017-12-23 DIAGNOSIS — M545 Low back pain, unspecified: Secondary | ICD-10-CM

## 2017-12-23 DIAGNOSIS — M9905 Segmental and somatic dysfunction of pelvic region: Secondary | ICD-10-CM

## 2017-12-23 DIAGNOSIS — M9908 Segmental and somatic dysfunction of rib cage: Secondary | ICD-10-CM | POA: Diagnosis not present

## 2017-12-23 DIAGNOSIS — M9902 Segmental and somatic dysfunction of thoracic region: Secondary | ICD-10-CM | POA: Diagnosis not present

## 2017-12-23 MED ORDER — KETOROLAC TROMETHAMINE 60 MG/2ML IM SOLN
60.0000 mg | Freq: Once | INTRAMUSCULAR | Status: AC
  Administered 2017-12-23: 60 mg via INTRAMUSCULAR

## 2017-12-23 MED ORDER — METHYLPREDNISOLONE ACETATE 80 MG/ML IJ SUSP
80.0000 mg | Freq: Once | INTRAMUSCULAR | Status: AC
  Administered 2017-12-23: 80 mg via INTRAMUSCULAR

## 2017-12-23 NOTE — Progress Notes (Deleted)
PROCEDURE NOTE : OSTEOPATHIC MANIPULATION The decision today to treat with Osteopathic Manipulative Therapy (OMT) was based on physical exam findings. Verbal consent was obtained following a discussion with the patient regarding the of risks, benefits and potential side effects, including an acute pain flare,post manipulation soreness and need for repeat treatments. {Blank single:19197::"Additionally, we specifically discussed the minimal risk of  injury to neurovascular structures associated with Cervical manipulation."," "}   {Contraindications to OMT:21555::"NONE"}  Manipulation was performed as below: Regions Treated OMT Techniques Used  Thoracic spine Ribs Lumbar spine Pelvis Sacrum HVLA muscle energy myofascial release   The patient tolerated the treatment well and reported Improved symptoms following treatment today. Patient was given medications, exercises, stretches and lifestyle modifications per AVS and verbally.   OSTEOPATHIC/STRUCTURAL EXAM:   {THORACIC (Optional):21155} {RIBS (Optional):21156} {LUMBAR (Optional):21146} {PELVIS (Optional):21166} {SACRUM (Optional):21141}

## 2017-12-23 NOTE — Progress Notes (Signed)
Alison FellsMichael D. Delorise [name], DO  [facility] [phone]  [name] - 27 y.o. female MRN [id number]  Date of birth: [birth year]  Visit Date:   PCP: Pearline Cablesopland, [name], MD   Referred by: Pearline Cablesopland, [name], MD   SUBJECTIVE:  Chief Complaint  Patient presents with  . f/u LBP  . f/u hip pain with n/t in LLE    HPI: Patient reports insidious onset of worsening low back left greater than right gluteal pain and left leg pain.  She has had issues like this in the past pain radiating all the way down into the left foot greater than right.  She denies any significant weakness with this.  She is having pain with any type of activity.  He has been helpful anti-inflammatories are minimally helpful.  Previously she has been seen by physical therapy when it has been this severe and that is been helpful as well as osteopathic manipulation.  REVIEW OF SYSTEMS: Denies night time disturbances. Denies fevers, chills, or night sweats. Denies unexplained weight loss. Denies personal history of cancer. Denies changes in bowel or bladder habits. Denies recent unreported falls. Denies new or worsening dyspnea or wheezing. Denies headaches or dizziness.  Denies numbness, tingling or weakness  In the extremities.  Denies dizziness or presyncopal episodes Denies lower extremity edema   HISTORY:  Prior history reviewed and updated per electronic medical record.  Social History   Occupational History  . Not on file  Tobacco Use  . Smoking status: Never Smoker  . Smokeless tobacco: Never Used  Substance and Sexual Activity  . Alcohol use: No  . Drug use: No  . Sexual activity: Yes    Birth control/protection: None   Social History   Social History Narrative  . Not on file     DATA OBTAINED & REVIEWED:  Recent Labs    08/04/17 1110  CALCIUM 9.7  AST 11  ALT 10  TSH 2.27   No problems updated. No specialty comments  available.  OBJECTIVE:  VS:  HT:5\' 5"  (165.1 cm)   WT:123 lb 3.2 oz (55.9 kg)  BMI:20.5    BP:100/60  HR:61bpm  TEMP: ( )  RESP:98 %   PHYSICAL EXAM: CONSTITUTIONAL: Well-developed, Well-nourished and In no acute distress PSYCHIATRIC : Alert & appropriately interactive. and Not depressed or anxious appearing. RESPIRATORY : No increased work of breathing and Trachea Midline EYES : Pupils are equal., EOM intact without nystagmus. and No scleral icterus.  VASCULAR EXAM : Warm and well perfused NEURO: unremarkable  MSK Exam: Good cervical and lumbar range of motion although there are functional limitations. Negative Spurling's compression test and Lhermitte's compression test.   Upper extremity lower extremity strength is 5/5 in all myotomes. Normal sensation Significant anterior chain dominant posture.   ASSESSMENT   1. Low back pain, unspecified back pain laterality, unspecified chronicity, unspecified whether sciatica present   2. Somatic dysfunction of thoracic region   3. Somatic dysfunction of lumbar region   4. Somatic dysfunction of rib cage region   5. Somatic dysfunction of pelvis region   6. Somatic dysfunction of sacral region     PLAN:  Pertinent additional documentation may be included in corresponding procedure notes, imaging studies, problem based documentation and patient instructions.  Procedures:  PROCEDURE NOTE: OSTEOPATHIC MANIPULATION  The decision today to treat with Osteopathic Manipulative Therapy (OMT) was based on physical exam findings. Verbal consent was obtained following a discussion with the  patient regarding the of risks, benefits and potential side effects, including an acute pain flare,post manipulation soreness and need for repeat treatments.   Contraindications to OMT: NONE Manipulation was performed as below: Regions Treated & Osteopathic Exam Findings Ribs 8 posterior on the right T2 extended side bent right T4 through T6 rotated  left L3 FRS right Left on left sacral torsion Right anterior innominate  OMT Techniques Used: HVLA muscle energy myofascial release HVLA - Long Lever  The patient tolerated the treatment well and reported Improved symptoms following treatment today. Patient was given medications, exercises, stretches and lifestyle modifications per AVS and verbally.      Medications:  Meds ordered this encounter  Medications  . methylPREDNISolone acetate (DEPO-MEDROL) injection 80 mg  . ketorolac (TORADOL) injection 60 mg    Discussion/Instructions: No problem-specific Assessment & Plan notes found for this encounter.  Discussed red flag symptoms that warrant earlier emergent evaluation and patient voices understanding. Activity modifications and the importance of avoiding exacerbating activities (limiting pain to no more than a 4 / 10 during or following activity) recommended and discussed.  Given the acute component we will go ahead and give her a Depo-Medrol and Toradol injection.  Return in about 2 weeks (around 01/06/2018) for consideration of repeat Osteopathic Manipulation.          Andrena Mews, DO     Sports Medicine Physician

## 2017-12-25 ENCOUNTER — Encounter: Payer: Self-pay | Admitting: Sports Medicine

## 2017-12-30 DIAGNOSIS — M545 Low back pain: Secondary | ICD-10-CM | POA: Diagnosis not present

## 2018-01-05 ENCOUNTER — Encounter: Payer: Self-pay | Admitting: Sports Medicine

## 2018-01-05 ENCOUNTER — Ambulatory Visit: Payer: BLUE CROSS/BLUE SHIELD | Admitting: Sports Medicine

## 2018-01-05 VITALS — BP 100/60 | HR 76 | Ht 65.0 in | Wt 121.8 lb

## 2018-01-05 DIAGNOSIS — M9903 Segmental and somatic dysfunction of lumbar region: Secondary | ICD-10-CM | POA: Diagnosis not present

## 2018-01-05 DIAGNOSIS — M9904 Segmental and somatic dysfunction of sacral region: Secondary | ICD-10-CM

## 2018-01-05 DIAGNOSIS — M545 Low back pain, unspecified: Secondary | ICD-10-CM

## 2018-01-05 DIAGNOSIS — M9908 Segmental and somatic dysfunction of rib cage: Secondary | ICD-10-CM

## 2018-01-05 DIAGNOSIS — M9902 Segmental and somatic dysfunction of thoracic region: Secondary | ICD-10-CM | POA: Diagnosis not present

## 2018-01-05 DIAGNOSIS — M9905 Segmental and somatic dysfunction of pelvic region: Secondary | ICD-10-CM

## 2018-01-05 MED ORDER — CYCLOBENZAPRINE HCL 5 MG PO TABS: 5.0000 mg | ORAL_TABLET | Freq: Every day | ORAL | 1 refills | Status: DC

## 2018-01-05 NOTE — Progress Notes (Signed)
[name], DO  [facility] [phone]  Lindwood CokeMollie P Zapata - 27 y.o. female MRN [id number]  Date of birth: November 24, 1990  Visit Date: 01/05/2018  PCP: Pearline Cablesopland, [name], MD   Referred by: Pearline Cablesopland, [name], MD  SUBJECTIVE:  No chief complaint on file.   HPI: Patient is here for follow-up of her low back pain and left leg pain.  She reports feeling as though it slightly worsening.  She is getting some nighttime disturbances but typically this is minimal.  She feels as though her vertebrae are "rubbing together" this resulted in her being seen in urgent care last week.  She is not getting any radiating pain.  REVIEW OF SYSTEMS: Denies fevers, chills, recent weight gain or weight loss.  No night sweats.  Pt denies any change in bowel or bladder habits, muscle weakness, numbness or falls associated with this pain.  HISTORY:  Prior history reviewed and updated per electronic medical record.  Patient Active Problem List   Diagnosis Date Noted  . Vegan diet 11/08/2015  . Pre-menstrual syndrome 11/08/2015   Social History   Occupational History  . Not on file  Tobacco Use  . Smoking status: Never Smoker  . Smokeless tobacco: Never Used  Substance and Sexual Activity  . Alcohol use: No  . Drug use: No  . Sexual activity: Yes    Birth control/protection: None   Social History   Social History Narrative  . Not on file    OBJECTIVE:  VS:  HT:5\' 5"  (165.1 cm)   WT:121 lb 12.8 oz (55.2 kg)  BMI:20.27    BP:100/60  HR:76bpm  TEMP: ( )  RESP:98 %   PHYSICAL EXAM: Adult female. No acute distress.  Alert and appropriate. Good cervical and lumbar range of motion although there are functional limitations. Negative straight leg raise bilaterally Upper extremity lower extremity strength is 5/5 in all myotomes. Normal sensation Significant anterior chain dominant posture.    ASSESSMENT:   1. Somatic dysfunction of  thoracic region   2. Low back pain, unspecified back pain laterality, unspecified chronicity, unspecified whether sciatica present   3. Somatic dysfunction of rib cage region   4. Somatic dysfunction of lumbar region   5. Somatic dysfunction of pelvis region   6. Somatic dysfunction of sacral region     PROCEDURES:  PROCEDURE NOTE: OSTEOPATHIC MANIPULATION  The decision today to treat with Osteopathic Manipulative Therapy (OMT) was based on physical exam findings. Verbal consent was obtained following a discussion with the patient regarding the of risks, benefits and potential side effects, including an acute pain flare,post manipulation soreness and need for repeat treatments.   Contraindications to OMT: NONE Manipulation was performed as below: Regions Treated & Osteopathic Exam Findings THORACIC SPINE:  T4 - 9 Neutral, rotated LEFT, sidebent RIGHT RIBS:  Rib 7 Right  Posterior LUMBAR SPINE:  L4 FRS RIGHT (Flexed, Rotated & Sidebent) PELVIS:  Right psoas spasm Right anterior innonimate SACRUM:  L on L sacral torsion  OMT Techniques Used: HVLA muscle energy myofascial release soft tissue  The patient tolerated the treatment well and reported Improved symptoms following treatment today. Patient was given medications, exercises, stretches and lifestyle modifications per AVS and verbally.      PLAN:  Pertinent additional documentation may be included in corresponding procedure notes, imaging studies, problem based documentation and patient instructions.  No problem-specific Assessment & Plan notes found for this encounter.  She does have generalized  muscle tightness in spasms.  Trial of Flexeril for hopefully reducing the extent of her nighttime discomfort  Continue previously prescribed home exercise program.   Osteopathic manipulation was performed today based on physical exam findings.  Patient has responded well to osteopathic manipulation previously the prior  manipulation did not provide permanent long lasting relief.  The patient does feel as though there was significant benefit to the prior manipulation and they wished for repeat manipulation today.  They understand that home therapeutic exercises are critical part of the healing/treatment process and will continue with self treatment between now and their next visit as outlined.  The patient understands that the frequency of visits is meant to provide a stimulus to promote the body's own ability to heal and is not meant to be the sole means for improvement in their symptoms.  Activity modifications and the importance of avoiding exacerbating activities (limiting pain to no more than a 4 / 10 during or following activity) recommended and discussed.  Discussed red flag symptoms that warrant earlier emergent evaluation and patient voices understanding.   Meds ordered this encounter  Medications  . cyclobenzaprine (FLEXERIL) 5 MG tablet    Sig: Take 1 tablet (5 mg total) by mouth at bedtime.    Dispense:  30 tablet    Refill:  1   Return in about 2 weeks (around 01/19/2018) for consideration of repeat Osteopathic Manipulation.          Andrena MewsMichael D Dinesh Ulysse, DO    [facility] Physician

## 2018-01-15 DIAGNOSIS — F331 Major depressive disorder, recurrent, moderate: Secondary | ICD-10-CM | POA: Diagnosis not present

## 2018-01-15 DIAGNOSIS — F411 Generalized anxiety disorder: Secondary | ICD-10-CM | POA: Diagnosis not present

## 2018-01-21 ENCOUNTER — Ambulatory Visit: Payer: BLUE CROSS/BLUE SHIELD | Admitting: Sports Medicine

## 2018-02-05 DIAGNOSIS — F411 Generalized anxiety disorder: Secondary | ICD-10-CM | POA: Diagnosis not present

## 2018-02-05 DIAGNOSIS — F331 Major depressive disorder, recurrent, moderate: Secondary | ICD-10-CM | POA: Diagnosis not present

## 2018-02-20 ENCOUNTER — Encounter: Payer: Self-pay | Admitting: Sports Medicine

## 2018-02-21 ENCOUNTER — Encounter: Payer: Self-pay | Admitting: Sports Medicine

## 2018-02-24 ENCOUNTER — Encounter: Payer: Self-pay | Admitting: Medical

## 2018-02-24 ENCOUNTER — Ambulatory Visit: Payer: BLUE CROSS/BLUE SHIELD | Admitting: Medical

## 2018-02-24 VITALS — BP 106/56 | HR 119 | Temp 98.8°F | Resp 16 | Ht 65.0 in | Wt 126.0 lb

## 2018-02-24 DIAGNOSIS — M791 Myalgia, unspecified site: Secondary | ICD-10-CM | POA: Diagnosis not present

## 2018-02-24 DIAGNOSIS — J029 Acute pharyngitis, unspecified: Secondary | ICD-10-CM

## 2018-02-24 DIAGNOSIS — R059 Cough, unspecified: Secondary | ICD-10-CM

## 2018-02-24 DIAGNOSIS — R05 Cough: Secondary | ICD-10-CM | POA: Diagnosis not present

## 2018-02-24 DIAGNOSIS — J111 Influenza due to unidentified influenza virus with other respiratory manifestations: Secondary | ICD-10-CM | POA: Diagnosis not present

## 2018-02-24 LAB — POC INFLUENZA A&B (BINAX/QUICKVUE)
INFLUENZA A, POC: NEGATIVE
INFLUENZA B, POC: NEGATIVE

## 2018-02-24 MED ORDER — OSELTAMIVIR PHOSPHATE 75 MG PO CAPS: 75.0000 mg | ORAL_CAPSULE | Freq: Two times a day (BID) | ORAL | 0 refills | Status: DC

## 2018-02-24 MED ORDER — FLUTICASONE PROPIONATE 50 MCG/ACT NA SUSP: 2.0000 | Freq: Every day | NASAL | 1 refills | Status: AC

## 2018-02-24 MED ORDER — BENZONATATE 100 MG PO CAPS: 100.0000 mg | ORAL_CAPSULE | Freq: Three times a day (TID) | ORAL | 0 refills | Status: DC | PRN

## 2018-02-24 NOTE — Patient Instructions (Addendum)
You do have acute onset of flulike symptoms.  Exposure to flu through persons at your workplace.  Will prescribe Tamiflu.  Please start that today.  I recommend taking ibuprofen over-the-counter tablets 600 to 800 mg every 8 hours.  Make sure that you are staying well-hydrated.  For cough, I am prescribing benzonatate. For any nasal congestion, I am prescribing Flonase.  Your throat exam does not appear impressive presently.  We did a rapid strep test.  Medical assistant will let you know the results.  If positive test then we will give you antibiotic as well.  Also regarding flu, I want you to be aware that sometimes you can get secondary bacterial infections.  If you develop any sinus infection symptoms, bronchitis, worse sore throat than presently or ear pain then let me know.  In that event we will give you antibiotic.  Follow-up in 7 days or as needed.

## 2018-02-24 NOTE — Progress Notes (Signed)
Subjective:    Patient ID: Alison Moss, female    DOB: 1990-02-24, 28 y.o.   MRN: [id number]  HPI  Pt in states last night got fever, chill and bodyaches since last night. States severe body aches diffusely. Mild dry cough on Sunday.  No flu vaccine this year. Mild st presently. 2 couple of coworkers who had flu. Pt son is in daycare. He had 100.2 for one day over weekend. Pt son was complaining recenlty of st. But she states not sure if he was trying to get attention.    Review of Systems  Constitutional: Positive for fatigue. Negative for chills and fever.  HENT: Negative for congestion, ear pain, postnasal drip and rhinorrhea.   Respiratory: Positive for cough. Negative for chest tightness, shortness of breath and wheezing.   Cardiovascular: Negative for chest pain and palpitations.  Gastrointestinal: Negative for abdominal pain.  Musculoskeletal: Positive for myalgias. Negative for back pain, neck pain and neck stiffness.  Skin: Negative for pallor and rash.  Neurological: Negative for dizziness, syncope, weakness and headaches.  Hematological: Positive for adenopathy.  Psychiatric/Behavioral: Negative for behavioral problems, confusion, dysphoric mood and self-injury.    Past Medical History:  Diagnosis Date  . Anemia   . Asthma   . Breast cyst      Social History   Socioeconomic History  . Marital status: Married    Spouse name: Not on file  . Number of children: Not on file  . Years of education: Not on file  . Highest education level: Not on file  Occupational History  . Not on file  Social Needs  . Financial resource strain: Not on file  . Food insecurity:    Worry: Not on file    Inability: Not on file  . Transportation needs:    Medical: Not on file    Non-medical: Not on file  Tobacco Use  . Smoking status: Never Smoker  . Smokeless tobacco: Never Used  Substance and Sexual Activity  . Alcohol use: No  . Drug use: No  . Sexual activity: Yes   Birth control/protection: None  Lifestyle  . Physical activity:    Days per week: Not on file    Minutes per session: Not on file  . Stress: Not on file  Relationships  . Social connections:    Talks on phone: Not on file    Gets together: Not on file    Attends religious service: Not on file    Active member of club or organization: Not on file    Attends meetings of clubs or organizations: Not on file    Relationship status: Not on file  . Intimate partner violence:    Fear of current or ex partner: Not on file    Emotionally abused: Not on file    Physically abused: Not on file    Forced sexual activity: Not on file  Other Topics Concern  . Not on file  Social History Narrative  . Not on file    Past Surgical History:  Procedure Laterality Date  . BREAST BIOPSY Left   . NO PAST SURGERIES      Family History  Problem Relation Age of Onset  . Multiple sclerosis Mother     Allergies  Allergen Reactions  . Iodine Hives  . Shellfish Allergy Hives    Current Outpatient Medications on File Prior to Visit  Medication Sig Dispense Refill  . albuterol (PROVENTIL HFA;VENTOLIN HFA) 108 (90 Base) MCG/ACT inhaler  Inhale 2 puffs into the lungs every 6 (six) hours as needed for wheezing or shortness of breath. 1 Inhaler 2  . cyclobenzaprine (FLEXERIL) 5 MG tablet Take 1 tablet (5 mg total) by mouth at bedtime. 30 tablet 1  . sertraline (ZOLOFT) 25 MG tablet TAKE 1 TABLET BY MOUTH EVERYDAY AT BEDTIME 90 tablet 3  . SPRINTEC 28 0.25-35 MG-MCG tablet TAKE 1 (ONE) TABLET DAILY, CONTINUOUS ACTIVE PILLS  1   No current facility-administered medications on file prior to visit.     BP (!) 106/56   Pulse (!) 119   Temp 98.8 F (37.1 C) (Oral)   Resp 16   Ht 5\' 5"  (1.651 m)   Wt 126 lb (57.2 kg)   SpO2 100%   BMI 20.97 kg/m       Objective:   Physical Exam  General  Mental Status - Alert. General Appearance - Well groomed. Not in acute distress.  Skin Rashes- No  Rashes.  HEENT Head- Normal. Ear Auditory Canal - Left- Normal. Right - Normal.Tympanic Membrane- Left- Normal. Right- Normal. Eye Sclera/Conjunctiva- Left- Normal. Right- Normal. Nose & Sinuses Nasal Mucosa- Left-  Boggy and Congested. Right-  Boggy and  Congested.Bilateral no  maxillary and no  frontal sinus pressure. Mouth & Throat Lips: Upper Lip- Normal: no dryness, cracking, pallor, cyanosis, or vesicular eruption. Lower Lip-Normal: no dryness, cracking, pallor, cyanosis or vesicular eruption. Buccal Mucosa- Bilateral- No Aphthous ulcers. Oropharynx- No Discharge or Erythema. Tonsils: Characteristics- Bilateral- No Erythema or Congestion. Size/Enlargement- Bilateral- No enlargement. Discharge- bilateral-None.  Neck Neck- Supple. No Masses. Rt submandibular node mild swollen and tender.   Chest and Lung Exam Auscultation: Breath Sounds:-Clear even and unlabored.  Cardiovascular Auscultation:Rythm- Regular, rate and rhythm. Murmurs & Other Heart Sounds:Ausculatation of the heart reveal- No Murmurs.  Lymphatic Head & Neck General Head & Neck Lymphatics: Bilateral: Description- No Localized lymphadenopathy.       Assessment & Plan:  You do have acute onset of flulike symptoms.  Exposure to flu through persons at your workplace.  Will prescribe Tamiflu.  Please start that today.  I recommend taking ibuprofen over-the-counter tablets 600 to 800 mg every 8 hours.  Make sure that you are staying well-hydrated.  For cough, I am prescribing benzonatate. For any nasal congestion, I am prescribing Flonase.  Your throat exam does not appear impressive presently.  We did a rapid strep test.  Medical assistant will let you know the results.  If positive test then we will give you antibiotic as well.  Also regarding flu, I want you to be aware that sometimes you can get secondary bacterial infections.  If you develop any sinus infection symptoms, bronchitis, worse sore throat than  presently or ear pain then let me know.  In that event we will give you antibiotic.  Follow-up in 7 days or as needed.  Esperanza RichtersEdward Aaryn Sermon, PA-C

## 2018-02-25 ENCOUNTER — Encounter: Payer: Self-pay | Admitting: Medical

## 2018-02-26 ENCOUNTER — Telehealth: Payer: Self-pay | Admitting: Medical

## 2018-02-26 MED ORDER — AMOXICILLIN-POT CLAVULANATE 875-125 MG PO TABS: 1.0000 | ORAL_TABLET | Freq: Two times a day (BID) | ORAL | 0 refills | Status: DC

## 2018-02-26 NOTE — Telephone Encounter (Signed)
Pt sent my chart message complaining of sore throat. I had asked you to do rapid strep and let me know the result. I don't see that resulted. Did you do the rapid test.

## 2018-02-27 LAB — POCT RAPID STREP A (OFFICE): RAPID STREP A SCREEN: NEGATIVE

## 2018-02-27 NOTE — Telephone Encounter (Signed)
Done

## 2018-02-27 NOTE — Telephone Encounter (Signed)
Pt's strep and flu were both negative.

## 2018-02-27 NOTE — Addendum Note (Signed)
Addended by: Orlene Och on: 02/27/2018 10:01 AM   Modules accepted: Orders

## 2018-02-27 NOTE — Telephone Encounter (Signed)
Will you result the strep.

## 2018-03-03 ENCOUNTER — Encounter: Payer: Self-pay | Admitting: Medical

## 2018-03-03 ENCOUNTER — Ambulatory Visit: Payer: BLUE CROSS/BLUE SHIELD | Admitting: Medical

## 2018-03-03 VITALS — BP 106/47 | HR 74 | Temp 98.6°F | Resp 16 | Ht 60.0 in | Wt 125.2 lb

## 2018-03-03 DIAGNOSIS — J0100 Acute maxillary sinusitis, unspecified: Secondary | ICD-10-CM | POA: Diagnosis not present

## 2018-03-03 DIAGNOSIS — R059 Cough, unspecified: Secondary | ICD-10-CM

## 2018-03-03 DIAGNOSIS — R05 Cough: Secondary | ICD-10-CM

## 2018-03-03 DIAGNOSIS — J40 Bronchitis, not specified as acute or chronic: Secondary | ICD-10-CM | POA: Diagnosis not present

## 2018-03-03 MED ORDER — BENZONATATE 100 MG PO CAPS: 100.0000 mg | ORAL_CAPSULE | Freq: Three times a day (TID) | ORAL | 0 refills | Status: DC | PRN

## 2018-03-03 MED ORDER — FLUCONAZOLE 150 MG PO TABS: 150.0000 mg | ORAL_TABLET | Freq: Once | ORAL | 0 refills | Status: AC

## 2018-03-03 MED ORDER — AZITHROMYCIN 250 MG PO TABS: ORAL_TABLET | ORAL | 0 refills | Status: DC

## 2018-03-03 NOTE — Progress Notes (Signed)
Subjective:    Patient ID: Alison Moss, female    DOB: Mar 31, 1990, 28 y.o.   MRN: [id number]  HPI  Pt in for follow up. Pt had flu syndrome and I prescribed tamiflu. Pt states her son dx with flu next day. Pt flu test was negative but I treated her clinically.   Pt states over last week developed severe nasal congestion and blowing out blood tinged mucus. Also coughing up mucus. She states cough today severe and repeteive that made her vomit. Vomit looked like mucus.   Pt called me stating she felt worse with st so I sent in augmentin. She is about 4 days into the augmentin.     Review of Systems  Constitutional: Negative for chills, fatigue and fever.  HENT: Positive for congestion, sinus pressure and sinus pain.   Respiratory: Positive for cough. Negative for shortness of breath and wheezing.   Cardiovascular: Negative for chest pain and palpitations.  Gastrointestinal: Negative for abdominal pain, constipation, diarrhea, nausea and vomiting.  Musculoskeletal: Negative for back pain and myalgias.  Skin: Negative for rash.  Hematological: Negative for adenopathy. Does not bruise/bleed easily.  Psychiatric/Behavioral: Negative for behavioral problems and confusion. The patient is not nervous/anxious.     Past Medical History:  Diagnosis Date  . Anemia   . Asthma   . Breast cyst      Social History   Socioeconomic History  . Marital status: Married    Spouse name: Not on file  . Number of children: Not on file  . Years of education: Not on file  . Highest education level: Not on file  Occupational History  . Not on file  Social Needs  . Financial resource strain: Not on file  . Food insecurity:    Worry: Not on file    Inability: Not on file  . Transportation needs:    Medical: Not on file    Non-medical: Not on file  Tobacco Use  . Smoking status: Never Smoker  . Smokeless tobacco: Never Used  Substance and Sexual Activity  . Alcohol use: No  . Drug  use: No  . Sexual activity: Yes    Birth control/protection: None  Lifestyle  . Physical activity:    Days per week: Not on file    Minutes per session: Not on file  . Stress: Not on file  Relationships  . Social connections:    Talks on phone: Not on file    Gets together: Not on file    Attends religious service: Not on file    Active member of club or organization: Not on file    Attends meetings of clubs or organizations: Not on file    Relationship status: Not on file  . Intimate partner violence:    Fear of current or ex partner: Not on file    Emotionally abused: Not on file    Physically abused: Not on file    Forced sexual activity: Not on file  Other Topics Concern  . Not on file  Social History Narrative  . Not on file    Past Surgical History:  Procedure Laterality Date  . BREAST BIOPSY Left   . NO PAST SURGERIES      Family History  Problem Relation Age of Onset  . Multiple sclerosis Mother     Allergies  Allergen Reactions  . Iodine Hives  . Shellfish Allergy Hives    Current Outpatient Medications on File Prior to Visit  Medication  Sig Dispense Refill  . albuterol (PROVENTIL HFA;VENTOLIN HFA) 108 (90 Base) MCG/ACT inhaler Inhale 2 puffs into the lungs every 6 (six) hours as needed for wheezing or shortness of breath. 1 Inhaler 2  . amoxicillin-clavulanate (AUGMENTIN) 875-125 MG tablet Take 1 tablet by mouth 2 (two) times daily. 20 tablet 0  . benzonatate (TESSALON) 100 MG capsule Take 1 capsule (100 mg total) by mouth 3 (three) times daily as needed for cough. 30 capsule 0  . cyclobenzaprine (FLEXERIL) 5 MG tablet Take 1 tablet (5 mg total) by mouth at bedtime. 30 tablet 1  . fluticasone (FLONASE) 50 MCG/ACT nasal spray Place 2 sprays into both nostrils daily. 16 g 1  . oseltamivir (TAMIFLU) 75 MG capsule Take 1 capsule (75 mg total) by mouth 2 (two) times daily. 10 capsule 0  . sertraline (ZOLOFT) 25 MG tablet TAKE 1 TABLET BY MOUTH EVERYDAY AT  BEDTIME 90 tablet 3  . SPRINTEC 28 0.25-35 MG-MCG tablet TAKE 1 (ONE) TABLET DAILY, CONTINUOUS ACTIVE PILLS  1   No current facility-administered medications on file prior to visit.     BP (!) 106/47   Pulse 74   Temp 98.6 F (37 C) (Oral)   Resp 16   Ht 5' (1.524 m)   Wt 125 lb 3.2 oz (56.8 kg)   SpO2 100%   BMI 24.45 kg/m       Objective:   Physical Exam  General  Mental Status - Alert. General Appearance - Well groomed. Not in acute distress.  Skin Rashes- No Rashes.  HEENT Head- Normal. Ear Auditory Canal - Left- Normal. Right - Normal.Tympanic Membrane- Left- Normal. Right- Normal. Eye Sclera/Conjunctiva- Left- Normal. Right- Normal. Nose & Sinuses Nasal Mucosa- Left-  Boggy and Congested. Right-  Boggy and  Congested.Bilateral maxillary and frontal sinus pressure. Mouth & Throat Lips: Upper Lip- Normal: no dryness, cracking, pallor, cyanosis, or vesicular eruption. Lower Lip-Normal: no dryness, cracking, pallor, cyanosis or vesicular eruption. Buccal Mucosa- Bilateral- No Aphthous ulcers. Oropharynx- No Discharge or Erythema. Tonsils: Characteristics- Bilateral- No Erythema or Congestion. Size/Enlargement- Bilateral- No enlargement. Discharge- bilateral-None.  Neck Neck- Supple. No Masses.   Chest and Lung Exam Auscultation: Breath Sounds:-Clear even and unlabored.  Cardiovascular Auscultation:Rythm- Regular, rate and rhythm. Murmurs & Other Heart Sounds:Ausculatation of the heart reveal- No Murmurs.  Lymphatic Head & Neck General Head & Neck Lymphatics: Bilateral: Description- No Localized lymphadenopathy.       Assessment & Plan:  It does appear that you had the flu since your son was diagnosed next day with a positive flu test.  You did go ahead and take the Tamiflu as well.  Presently over the last week it appears that you to get secondary sinus infection and bronchitis.  I did send you Augmentin prescription after you sent me a my chart.  You  are 4 days into Augmentin treatment that think you would be beneficial to add a azithromycin antibiotic.  Please make sure that you increase probiotics in your diet or get probiotics over-the-counter.  Also sent in prescription of Diflucan in the event that you get yeast infection.  For nasal congestion, continue with Flonase nasal spray.  For cough, I refilled your benzonatate.  Remember that 200 mg of benzonatate is max dose.  Make sure that you stay well-hydrated.  Get plenty of rest.  Let me know if you not feeling much better by Friday/before the weekend.  Potentially could see you on Friday or arrange nurse visit for Rocephin injection, steroid  injection and possible chest x-ray.  Esperanza RichtersEdward Dashauna Heymann, PA-C

## 2018-03-03 NOTE — Patient Instructions (Signed)
It does appear that you had the flu since your son was diagnosed next day with a positive flu test.  You did go ahead and take the Tamiflu as well.  Presently over the last week it appears that you to get secondary sinus infection and bronchitis.  I did send you Augmentin prescription after you sent me a my chart.  You are 4 days into Augmentin treatment that think you would be beneficial to add a azithromycin antibiotic.  Please make sure that you increase probiotics in your diet or get probiotics over-the-counter.  Also sent in prescription of Diflucan in the event that you get yeast infection.  For nasal congestion, continue with Flonase nasal spray.  For cough, I refilled your benzonatate.  Remember that 200 mg of benzonatate is max dose.  Make sure that you stay well-hydrated.  Get plenty of rest.  Let me know if you not feeling much better by Friday/before the weekend.  Potentially could see you on Friday or arrange nurse visit for Rocephin injection, steroid injection and possible chest x-ray.

## 2018-03-05 DIAGNOSIS — F411 Generalized anxiety disorder: Secondary | ICD-10-CM | POA: Diagnosis not present

## 2018-03-05 DIAGNOSIS — F331 Major depressive disorder, recurrent, moderate: Secondary | ICD-10-CM | POA: Diagnosis not present

## 2018-03-11 DIAGNOSIS — Z01419 Encounter for gynecological examination (general) (routine) without abnormal findings: Secondary | ICD-10-CM | POA: Diagnosis not present

## 2018-03-11 DIAGNOSIS — Z6822 Body mass index (BMI) 22.0-22.9, adult: Secondary | ICD-10-CM | POA: Diagnosis not present

## 2018-03-19 DIAGNOSIS — F411 Generalized anxiety disorder: Secondary | ICD-10-CM | POA: Diagnosis not present

## 2018-03-19 DIAGNOSIS — F331 Major depressive disorder, recurrent, moderate: Secondary | ICD-10-CM | POA: Diagnosis not present

## 2018-04-01 ENCOUNTER — Ambulatory Visit: Payer: Self-pay | Admitting: *Deleted

## 2018-04-01 NOTE — Telephone Encounter (Signed)
Pt reports dry cough, runny nose, mild chest tightness, onset yesterday. States temp yesterday 99.1, this AM 98.5, without tylenol. Denies SOB, has not traveled. States cough is mild. States workplace wanted pt to be tested for COVID-19. Criteria reviewed with pt, verbalized understanding. Home Care advise given. States will follow, CB if symptoms worsen.  Reason for Disposition . Cough with cold symptoms (e.g., runny nose, postnasal drip, throat clearing)  Answer Assessment - Initial Assessment Questions 1. ONSET: "When did the cough begin?"      Yesterday 2. SEVERITY: "How bad is the cough today?"      mild 3. RESPIRATORY DISTRESS: "Describe your breathing."      WNL 4. FEVER: "Do you have a fever?" If so, ask: "What is your temperature, how was it measured, and when did it start?"     99.1 last night      This AM 98.5 without tylenol 5. HEMOPTYSIS: "Are you coughing up any blood?" If so ask: "How much?" (flecks, streaks, tablespoons, etc.)     no 6. TREATMENT: "What have you done so far to treat the cough?" (e.g., meds, fluids, humidifier)    no 7. CARDIAC HISTORY: "Do you have any history of heart disease?" (e.g., heart attack, congestive heart failure)     no 8. LUNG HISTORY: "Do you have any history of lung disease?"  (e.g., pulmonary embolus, asthma, emphysema)     no 9. PE RISK FACTORS: "Do you have a history of blood clots?" (or: recent major surgery, recent prolonged travel, bedridden)     no 10. OTHER SYMPTOMS: "Do you have any other symptoms? (e.g., runny nose, wheezing, chest pain)       Chest tightness, constant 11. PREGNANCY: "Is there any chance you are pregnant?" "When was your last menstrual period?"       no 12. TRAVEL: "Have you traveled out of the country in the last month?" (e.g., travel history, exposures)       no  Protocols used: COUGH - ACUTE NON-PRODUCTIVE-A-AH

## 2018-04-02 DIAGNOSIS — F411 Generalized anxiety disorder: Secondary | ICD-10-CM | POA: Diagnosis not present

## 2018-04-02 DIAGNOSIS — F331 Major depressive disorder, recurrent, moderate: Secondary | ICD-10-CM | POA: Diagnosis not present

## 2018-04-09 DIAGNOSIS — F411 Generalized anxiety disorder: Secondary | ICD-10-CM | POA: Diagnosis not present

## 2018-04-09 DIAGNOSIS — F331 Major depressive disorder, recurrent, moderate: Secondary | ICD-10-CM | POA: Diagnosis not present

## 2018-04-10 DIAGNOSIS — Z3049 Encounter for surveillance of other contraceptives: Secondary | ICD-10-CM | POA: Diagnosis not present

## 2018-04-10 DIAGNOSIS — Z30017 Encounter for initial prescription of implantable subdermal contraceptive: Secondary | ICD-10-CM | POA: Diagnosis not present

## 2018-04-10 DIAGNOSIS — F4323 Adjustment disorder with mixed anxiety and depressed mood: Secondary | ICD-10-CM | POA: Diagnosis not present

## 2018-04-16 DIAGNOSIS — F331 Major depressive disorder, recurrent, moderate: Secondary | ICD-10-CM | POA: Diagnosis not present

## 2018-04-16 DIAGNOSIS — F411 Generalized anxiety disorder: Secondary | ICD-10-CM | POA: Diagnosis not present

## 2018-04-23 DIAGNOSIS — F411 Generalized anxiety disorder: Secondary | ICD-10-CM | POA: Diagnosis not present

## 2018-04-23 DIAGNOSIS — F331 Major depressive disorder, recurrent, moderate: Secondary | ICD-10-CM | POA: Diagnosis not present

## 2018-04-29 DIAGNOSIS — F4323 Adjustment disorder with mixed anxiety and depressed mood: Secondary | ICD-10-CM | POA: Diagnosis not present

## 2018-04-30 DIAGNOSIS — F331 Major depressive disorder, recurrent, moderate: Secondary | ICD-10-CM | POA: Diagnosis not present

## 2018-04-30 DIAGNOSIS — F411 Generalized anxiety disorder: Secondary | ICD-10-CM | POA: Diagnosis not present

## 2018-05-07 DIAGNOSIS — F411 Generalized anxiety disorder: Secondary | ICD-10-CM | POA: Diagnosis not present

## 2018-05-07 DIAGNOSIS — F331 Major depressive disorder, recurrent, moderate: Secondary | ICD-10-CM | POA: Diagnosis not present

## 2018-05-08 DIAGNOSIS — F4323 Adjustment disorder with mixed anxiety and depressed mood: Secondary | ICD-10-CM | POA: Diagnosis not present

## 2018-05-11 DIAGNOSIS — Z3046 Encounter for surveillance of implantable subdermal contraceptive: Secondary | ICD-10-CM | POA: Diagnosis not present

## 2018-05-11 DIAGNOSIS — Z3202 Encounter for pregnancy test, result negative: Secondary | ICD-10-CM | POA: Diagnosis not present

## 2018-05-14 DIAGNOSIS — F331 Major depressive disorder, recurrent, moderate: Secondary | ICD-10-CM | POA: Diagnosis not present

## 2018-05-14 DIAGNOSIS — F411 Generalized anxiety disorder: Secondary | ICD-10-CM | POA: Diagnosis not present

## 2018-05-18 DIAGNOSIS — F4323 Adjustment disorder with mixed anxiety and depressed mood: Secondary | ICD-10-CM | POA: Diagnosis not present

## 2018-05-21 DIAGNOSIS — F411 Generalized anxiety disorder: Secondary | ICD-10-CM | POA: Diagnosis not present

## 2018-05-21 DIAGNOSIS — F331 Major depressive disorder, recurrent, moderate: Secondary | ICD-10-CM | POA: Diagnosis not present

## 2018-05-25 ENCOUNTER — Encounter: Payer: Self-pay | Admitting: Family Medicine

## 2018-05-25 ENCOUNTER — Ambulatory Visit (INDEPENDENT_AMBULATORY_CARE_PROVIDER_SITE_OTHER): Payer: BLUE CROSS/BLUE SHIELD | Admitting: Family Medicine

## 2018-05-25 DIAGNOSIS — F419 Anxiety disorder, unspecified: Secondary | ICD-10-CM

## 2018-05-25 DIAGNOSIS — M545 Low back pain, unspecified: Secondary | ICD-10-CM

## 2018-05-25 DIAGNOSIS — F329 Major depressive disorder, single episode, unspecified: Secondary | ICD-10-CM | POA: Diagnosis not present

## 2018-05-25 DIAGNOSIS — F4323 Adjustment disorder with mixed anxiety and depressed mood: Secondary | ICD-10-CM | POA: Diagnosis not present

## 2018-05-25 DIAGNOSIS — F439 Reaction to severe stress, unspecified: Secondary | ICD-10-CM

## 2018-05-25 DIAGNOSIS — F32A Depression, unspecified: Secondary | ICD-10-CM

## 2018-05-25 MED ORDER — CYCLOBENZAPRINE HCL 5 MG PO TABS: 5.0000 mg | ORAL_TABLET | Freq: Every day | ORAL | 0 refills | Status: DC

## 2018-05-25 MED ORDER — CITALOPRAM HYDROBROMIDE 20 MG PO TABS: 20.0000 mg | ORAL_TABLET | Freq: Every day | ORAL | 5 refills | Status: DC

## 2018-05-25 NOTE — Progress Notes (Signed)
[facility] [address], [address] [place], [place] [address] [phone]  Date:  05/25/2018   Name:  Alison Moss   DOB:  07-13-1990   MRN:  [id number]  PCP:  Pearline Cables, MD    Chief Complaint: No chief complaint on file.   History of Present Illness:  Alison Moss is a 28 y.o. very pleasant female patient who presents with the following:  Virtual visit today due to pandemic Patient location is home, provider location is office Patient identity confirmed with 2 identifiers, she gives consent for virtual visit today  I last saw her in August 2019.  At that time she had recently restarted sertraline at 25 mg and was having good results with it We also added that gabapentin at bedtime which she had used in the past  Today she notes that she is having a flare up of back pain for about the last 3 days; however over the last 6 months she has had a lot more frequent flare ups of her back pain.  She estimates her back pain has gotten worse 6 -8 times over the past several months.  She is seeing ortho at [facility] later this week for consultation  However she has been using some muscle relaxer- flexeril-which she had leftover.  This is helpful to her in managing her back pain, she mostly uses it at night  She is having a tough time in her marriage, and her therapist recommended that she "start on an antidepressant" She has been seeing her personal therapist for about a year, and they are doing marriage counseling as well Her son is 86 yo and is in day care I explained her that sertraline actually is an SSRI, but she is taking such a low dose that may not be doing much She was on celexa, Prozac and lexapro in the past -she recalls that Celexa was the most effective for her.  She has some difficulty sleeping- of course the flexeril will help her sleep when she does take it On days that she does not use a muscle relaxer she is using  OTC sleep aids   She denies any active suicidal ideation, feels that she is safe Patient Active Problem List   Diagnosis Date Noted  . Vegan diet 11/08/2015  . Pre-menstrual syndrome 11/08/2015    Past Medical History:  Diagnosis Date  . Anemia   . Asthma   . Breast cyst     Past Surgical History:  Procedure Laterality Date  . BREAST BIOPSY Left   . NO PAST SURGERIES      Social History   Tobacco Use  . Smoking status: Never Smoker  . Smokeless tobacco: Never Used  Substance Use Topics  . Alcohol use: No  . Drug use: No    Family History  Problem Relation Age of Onset  . Multiple sclerosis Mother     Allergies  Allergen Reactions  . Iodine Hives  . Shellfish Allergy Hives    Medication list has been reviewed and updated.  Current Outpatient Medications on File Prior to Visit  Medication Sig Dispense Refill  . albuterol (PROVENTIL HFA;VENTOLIN HFA) 108 (90 Base) MCG/ACT inhaler Inhale 2 puffs into the lungs every 6 (six) hours as needed for wheezing or shortness of breath. 1 Inhaler 2  . amoxicillin-clavulanate (AUGMENTIN) 875-125 MG tablet Take 1 tablet by mouth 2 (two) times daily. 20 tablet 0  .  azithromycin (ZITHROMAX) 250 MG tablet Take 2 tablets by mouth on day 1, followed by 1 tablet by mouth daily for 4 days. 6 tablet 0  . benzonatate (TESSALON) 100 MG capsule Take 1 capsule (100 mg total) by mouth 3 (three) times daily as needed for cough. 30 capsule 0  . cyclobenzaprine (FLEXERIL) 5 MG tablet Take 1 tablet (5 mg total) by mouth at bedtime. 30 tablet 1  . fluticasone (FLONASE) 50 MCG/ACT nasal spray Place 2 sprays into both nostrils daily. 16 g 1  . oseltamivir (TAMIFLU) 75 MG capsule Take 1 capsule (75 mg total) by mouth 2 (two) times daily. 10 capsule 0  . sertraline (ZOLOFT) 25 MG tablet TAKE 1 TABLET BY MOUTH EVERYDAY AT BEDTIME 90 tablet 3  . SPRINTEC 28 0.25-35 MG-MCG tablet TAKE 1 (ONE) TABLET DAILY, CONTINUOUS ACTIVE PILLS  1   No current  facility-administered medications on file prior to visit.     Review of Systems:  As per HPI- otherwise negative.   Physical Examination: There were no vitals filed for this visit. There were no vitals filed for this visit. There is no height or weight on file to calculate BMI. Ideal Body Weight:    The [email or web address] video system is not working right now.  So I called the patient on the phone She sounds well, no cough, wheezing, tachypnea or distress is noted Spoke to pt for 11:16 today  Assessment and Plan:  Low back pain, unspecified back pain laterality, unspecified chronicity, unspecified whether sciatica present - Plan: cyclobenzaprine (FLEXERIL) 5 MG tablet  Stress at home - Plan: citalopram (CELEXA) 20 MG tablet  Anxiety and depression - Plan: citalopram (CELEXA) 20 MG tablet   Virtual visit today for concern of back pain and also depression and anxiety.  Kirt BoysMolly has an appointment with orthopedics later on this week for consultation regarding her back.  I will refill her muscle relaxer for her to use as needed in the interim She is reminded not to take flexeril while driving as it can cause sedation As above, she is also struggling with depression and difficulty in her marriage.  She has used citalopram in the past with good results.  We will have her stop sertraline, I will have her start back on Celexa 20 mg, increase to 40 mg in 2 weeks weeks.  She is seeing a counselor will continue to do so  I sent her the following instructions in a MyChart message:  It was nice to talk with you today, but I am sorry you are having a hard time As we discussed, I sent your muscle relaxer to the drugstore for you.  Use this as needed, but do not take it when you need to drive  Also, we will start you on citalopram or Celexa for depression and anxiety.  Stop taking sertraline before start on Celexa.  Start with 20 mg Celexa/citalopram daily, you can increase to 40 after about 2 weeks.  Please  seek care right away if you have any concern about self-harm.  Otherwise, please reply to me here in 3 or 4 weeks with an update.  Let us plan to visit in the office when possible, perhaps in 3 months Signed Abbe AmsterdamJessica Copland, MD

## 2018-05-25 NOTE — Patient Instructions (Addendum)
It was nice to talk with you today, but I am sorry you are having a hard time As we discussed, I sent your muscle relaxer to the drugstore for you.  Use this as needed, but do not take it when you need to drive  Also, we will start you on citalopram or Celexa for depression and anxiety.  Stop taking sertraline before start on Celexa.  Start with 20 mg Celexa/citalopram daily, you can increase to 40 after about 2 weeks.  Please seek care right away if you have any concern about self-harm.  Otherwise, please reply to me here in 3 or 4 weeks with an update.  Let us plan to visit in the office when possible, perhaps in 3 months

## 2018-05-27 DIAGNOSIS — M5441 Lumbago with sciatica, right side: Secondary | ICD-10-CM | POA: Diagnosis not present

## 2018-05-27 DIAGNOSIS — M5442 Lumbago with sciatica, left side: Secondary | ICD-10-CM | POA: Diagnosis not present

## 2018-06-01 DIAGNOSIS — M545 Low back pain: Secondary | ICD-10-CM | POA: Diagnosis not present

## 2018-06-03 DIAGNOSIS — M545 Low back pain: Secondary | ICD-10-CM | POA: Diagnosis not present

## 2018-06-04 DIAGNOSIS — F331 Major depressive disorder, recurrent, moderate: Secondary | ICD-10-CM | POA: Diagnosis not present

## 2018-06-04 DIAGNOSIS — F411 Generalized anxiety disorder: Secondary | ICD-10-CM | POA: Diagnosis not present

## 2018-06-10 DIAGNOSIS — F4323 Adjustment disorder with mixed anxiety and depressed mood: Secondary | ICD-10-CM | POA: Diagnosis not present

## 2018-06-18 DIAGNOSIS — F411 Generalized anxiety disorder: Secondary | ICD-10-CM | POA: Diagnosis not present

## 2018-06-18 DIAGNOSIS — F331 Major depressive disorder, recurrent, moderate: Secondary | ICD-10-CM | POA: Diagnosis not present

## 2018-06-25 DIAGNOSIS — F4323 Adjustment disorder with mixed anxiety and depressed mood: Secondary | ICD-10-CM | POA: Diagnosis not present

## 2018-07-02 DIAGNOSIS — F331 Major depressive disorder, recurrent, moderate: Secondary | ICD-10-CM | POA: Diagnosis not present

## 2018-07-02 DIAGNOSIS — F411 Generalized anxiety disorder: Secondary | ICD-10-CM | POA: Diagnosis not present

## 2018-07-06 ENCOUNTER — Telehealth: Payer: Self-pay | Admitting: Family Medicine

## 2018-07-06 DIAGNOSIS — F32A Depression, unspecified: Secondary | ICD-10-CM

## 2018-07-06 DIAGNOSIS — F419 Anxiety disorder, unspecified: Secondary | ICD-10-CM

## 2018-07-06 DIAGNOSIS — F439 Reaction to severe stress, unspecified: Secondary | ICD-10-CM

## 2018-07-06 DIAGNOSIS — F329 Major depressive disorder, single episode, unspecified: Secondary | ICD-10-CM

## 2018-07-06 MED ORDER — CITALOPRAM HYDROBROMIDE 40 MG PO TABS: 40.0000 mg | ORAL_TABLET | Freq: Every day | ORAL | 3 refills | Status: DC

## 2018-07-06 NOTE — Telephone Encounter (Signed)
Pt need need new prescription due to dosage change. Pt has ran out due to how she was suppose to take it.   Sent to [facility]

## 2018-07-06 NOTE — Telephone Encounter (Signed)
Called pt- she is doing well on 40 mg of celexa daily.  Will refill for her Meds ordered this encounter  Medications  . citalopram (CELEXA) 40 MG tablet    Sig: Take 1 tablet (40 mg total) by mouth daily.    Dispense:  90 tablet    Refill:  3

## 2018-07-06 NOTE — Telephone Encounter (Signed)
Please advise 

## 2018-07-16 DIAGNOSIS — F411 Generalized anxiety disorder: Secondary | ICD-10-CM | POA: Diagnosis not present

## 2018-07-16 DIAGNOSIS — F331 Major depressive disorder, recurrent, moderate: Secondary | ICD-10-CM | POA: Diagnosis not present

## 2018-07-23 DIAGNOSIS — F4323 Adjustment disorder with mixed anxiety and depressed mood: Secondary | ICD-10-CM | POA: Diagnosis not present

## 2018-07-27 ENCOUNTER — Ambulatory Visit (INDEPENDENT_AMBULATORY_CARE_PROVIDER_SITE_OTHER): Payer: BC Managed Care – PPO | Admitting: Family Medicine

## 2018-07-27 ENCOUNTER — Ambulatory Visit: Payer: Self-pay | Admitting: Family Medicine

## 2018-07-27 ENCOUNTER — Other Ambulatory Visit: Payer: Self-pay

## 2018-07-27 ENCOUNTER — Telehealth: Payer: Self-pay | Admitting: Family Medicine

## 2018-07-27 ENCOUNTER — Encounter: Payer: Self-pay | Admitting: Family Medicine

## 2018-07-27 DIAGNOSIS — Z20822 Contact with and (suspected) exposure to covid-19: Secondary | ICD-10-CM

## 2018-07-27 DIAGNOSIS — Z20828 Contact with and (suspected) exposure to other viral communicable diseases: Secondary | ICD-10-CM | POA: Diagnosis not present

## 2018-07-27 NOTE — Progress Notes (Signed)
[facility] [address], [address] [place], [place] [id number] [phone] [phone]  Date:  07/27/2018   Name:  Alison CokeMollie P Moss   DOB:  1990-12-31   MRN:  [id number]  PCP:  Pearline Cablesopland, Mahlon Gabrielle C, MD    Chief Complaint: No chief complaint on file.   History of Present Illness:  Alison Moss is a 28 y.o. very pleasant female patient who presents with the following:  Generally healthy woman here today for virtual visit for possible COVID 19 symptoms Pt location is home, provider is at office Pt ID confirmed with 2 factors, she gives consent for virtual visit today   Her husband had a co-worker test positive for COVID last week. Her job is pretty conservative about testing and would like her to be tested She has noted some HA since yesterday- gets better with OTC medication She has allergies and does have some mild cough and nasal congestion No fever noted No GI symptoms- she did get nauseated with her HA however  No resp distress.   She is working from home right now She does not feel like the headache is anything out of the ordinary - does not wish to be seen for this issue  She is checking her temp at home and it is normal She does not check her BP at home  Patient Active Problem List   Diagnosis Date Noted  . Vegan diet 11/08/2015  . Pre-menstrual syndrome 11/08/2015    Past Medical History:  Diagnosis Date  . Anemia   . Asthma   . Breast cyst     Past Surgical History:  Procedure Laterality Date  . BREAST BIOPSY Left   . NO PAST SURGERIES      Social History   Tobacco Use  . Smoking status: Never Smoker  . Smokeless tobacco: Never Used  Substance Use Topics  . Alcohol use: No  . Drug use: No    Family History  Problem Relation Age of Onset  . Multiple sclerosis Mother     Allergies  Allergen Reactions  . Iodine Hives  . Shellfish Allergy Hives    Medication list has been reviewed and  updated.  Current Outpatient Medications on File Prior to Visit  Medication Sig Dispense Refill  . albuterol (PROVENTIL HFA;VENTOLIN HFA) 108 (90 Base) MCG/ACT inhaler Inhale 2 puffs into the lungs every 6 (six) hours as needed for wheezing or shortness of breath. 1 Inhaler 2  . benzonatate (TESSALON) 100 MG capsule Take 1 capsule (100 mg total) by mouth 3 (three) times daily as needed for cough. 30 capsule 0  . citalopram (CELEXA) 40 MG tablet Take 1 tablet (40 mg total) by mouth daily. 90 tablet 3  . cyclobenzaprine (FLEXERIL) 5 MG tablet Take 1 tablet (5 mg total) by mouth at bedtime. Use as needed for muscle spasm and back pain 30 tablet 0  . fluticasone (FLONASE) 50 MCG/ACT nasal spray Place 2 sprays into both nostrils daily. 16 g 1  . SPRINTEC 28 0.25-35 MG-MCG tablet TAKE 1 (ONE) TABLET DAILY, CONTINUOUS ACTIVE PILLS  1   No current facility-administered medications on file prior to visit.     Review of Systems:  As per HPI- otherwise negative. Her husband has a cough, but he also has allergies  He is getting tested today   Physical Examination: There were no vitals filed for this visit. There were no vitals filed for this visit. There is  no height or weight on file to calculate BMI. Ideal Body Weight:    Pt observed on video- she looks well, no cough or distress is noted   Assessment and Plan:   ICD-10-CM   1. Exposure to Covid-19 Virus  Z20.828    Refer to testing pool for covid 19 She will self quarantine in the meantime, will let us know if any other symptoms or concerns   Follow-up: No follow-ups on file.  No orders of the defined types were placed in this encounter.  No orders of the defined types were placed in this encounter.   @SIGN @   Signed Lamar Blinks, MD

## 2018-07-27 NOTE — Telephone Encounter (Signed)
Spoke with patient, scheduled her for COVID19 test 07/28/2018 at 8:45 am.  Testing protocol reviewed with patient.

## 2018-07-27 NOTE — Telephone Encounter (Signed)
PCP requests pt be tested for COVID-19. Please call pt to set up date / time.

## 2018-07-27 NOTE — Telephone Encounter (Signed)
Pt calling to request Covid testing. States her husband was directly exposed to co-worker who tested positive, resulted Friday. Husband is being tested today, has cough.  Pt reports had headache last night, "Better this AM after Advil. Also reports "Runny nose, stuffy at times." States had cough weeks ago but not presently. States afebrile, no SOB.  Pts email and ph# verified.  TN attempted to reach practice; no answer. Assured pt TN would route to practice for Dr. Lillie Fragmin review.   PLease advise: [phone]  Answer Assessment - Initial Assessment Questions 1. COVID-19 DIAGNOSIS: "Who made your Coronavirus (COVID-19) diagnosis?" "Was it confirmed by a positive lab test?" If not diagnosed by a HCP, ask "Are there lots of cases (community spread) where you live?" (See public health department website, if unsure)     NA 2. ONSET: "When did the COVID-19 symptoms start?"      LAst night 3. WORST SYMPTOM: "What is your worst symptom?" (e.g., cough, fever, shortness of breath, muscle aches)    Headache but "Better now" 4. COUGH: "Do you have a cough?" If so, ask: "How bad is the cough?"      For a few weeks, not presently 5. FEVER: "Do you have a fever?" If so, ask: "What is your temperature, how was it measured, and when did it start?"     no 6. RESPIRATORY STATUS: "Describe your breathing?" (e.g., shortness of breath, wheezing, unable to speak)     WNL 7. BETTER-SAME-WORSE: "Are you getting better, staying the same or getting worse compared to yesterday?"  If getting worse, ask, "In what way?"     no 8. HIGH RISK DISEASE: "Do you have any chronic medical problems?" (e.g., asthma, heart or lung disease, weak immune system, etc.)    no 9. PREGNANCY: "Is there any chance you are pregnant?" "When was your last menstrual period?"     no 10. OTHER SYMPTOMS: "Do you have any other symptoms?"  (e.g., chills, fatigue, headache, loss of smell or taste, muscle pain, sore throat)      Runny nose,  stuffy  Protocols used: CORONAVIRUS (COVID-19) DIAGNOSED OR SUSPECTED-A-AH

## 2018-07-27 NOTE — Telephone Encounter (Signed)
Needs virtual visit please.  

## 2018-07-28 ENCOUNTER — Other Ambulatory Visit: Payer: BC Managed Care – PPO

## 2018-07-28 DIAGNOSIS — R6889 Other general symptoms and signs: Secondary | ICD-10-CM | POA: Diagnosis not present

## 2018-07-28 DIAGNOSIS — Z20822 Contact with and (suspected) exposure to covid-19: Secondary | ICD-10-CM

## 2018-07-30 DIAGNOSIS — F411 Generalized anxiety disorder: Secondary | ICD-10-CM | POA: Diagnosis not present

## 2018-07-30 DIAGNOSIS — F331 Major depressive disorder, recurrent, moderate: Secondary | ICD-10-CM | POA: Diagnosis not present

## 2018-08-01 LAB — NOVEL CORONAVIRUS, NAA: SARS-CoV-2, NAA: NOT DETECTED

## 2018-08-18 DIAGNOSIS — J029 Acute pharyngitis, unspecified: Secondary | ICD-10-CM | POA: Diagnosis not present

## 2018-09-03 DIAGNOSIS — F411 Generalized anxiety disorder: Secondary | ICD-10-CM | POA: Diagnosis not present

## 2018-09-03 DIAGNOSIS — F331 Major depressive disorder, recurrent, moderate: Secondary | ICD-10-CM | POA: Diagnosis not present

## 2018-09-10 DIAGNOSIS — F411 Generalized anxiety disorder: Secondary | ICD-10-CM | POA: Diagnosis not present

## 2018-09-10 DIAGNOSIS — F331 Major depressive disorder, recurrent, moderate: Secondary | ICD-10-CM | POA: Diagnosis not present

## 2018-09-14 DIAGNOSIS — F4323 Adjustment disorder with mixed anxiety and depressed mood: Secondary | ICD-10-CM | POA: Diagnosis not present

## 2018-09-16 DIAGNOSIS — R232 Flushing: Secondary | ICD-10-CM | POA: Diagnosis not present

## 2018-09-24 DIAGNOSIS — F411 Generalized anxiety disorder: Secondary | ICD-10-CM | POA: Diagnosis not present

## 2018-09-24 DIAGNOSIS — F331 Major depressive disorder, recurrent, moderate: Secondary | ICD-10-CM | POA: Diagnosis not present

## 2018-10-01 ENCOUNTER — Encounter: Payer: Self-pay | Admitting: Family Medicine

## 2018-10-01 ENCOUNTER — Other Ambulatory Visit: Payer: Self-pay

## 2018-10-01 ENCOUNTER — Telehealth: Payer: Self-pay | Admitting: Family Medicine

## 2018-10-01 ENCOUNTER — Ambulatory Visit (INDEPENDENT_AMBULATORY_CARE_PROVIDER_SITE_OTHER): Payer: BC Managed Care – PPO | Admitting: Family Medicine

## 2018-10-01 VITALS — Ht 60.0 in | Wt 130.0 lb

## 2018-10-01 DIAGNOSIS — M545 Low back pain, unspecified: Secondary | ICD-10-CM

## 2018-10-01 MED ORDER — CYCLOBENZAPRINE HCL 5 MG PO TABS: 5.0000 mg | ORAL_TABLET | Freq: Every day | ORAL | 0 refills | Status: DC

## 2018-10-01 MED ORDER — PREDNISONE 20 MG PO TABS: 40.0000 mg | ORAL_TABLET | Freq: Every day | ORAL | 0 refills | Status: AC

## 2018-10-01 MED ORDER — TRAMADOL HCL 50 MG PO TABS: 50.0000 mg | ORAL_TABLET | Freq: Two times a day (BID) | ORAL | 0 refills | Status: AC | PRN

## 2018-10-01 NOTE — Telephone Encounter (Signed)
Patient on schedule to see Dr. Nani Ravens virtual today

## 2018-10-01 NOTE — Progress Notes (Signed)
Musculoskeletal Exam  Patient: Alison Moss DOB: 05/11/1990  DOS: 10/01/2018  SUBJECTIVE:  Chief Complaint:   Chief Complaint  Patient presents with  . Back Pain    lower back pain, seen sever doctors for this, severe pain, requesting medication    Alison Moss is a 28 y.o.  female for evaluation and treatment of her back pain. Due to COVID-19 pandemic, we are interacting via web portal for an electronic face-to-face visit. I verified patient's ID using 2 identifiers. Patient agreed to proceed with visit via this method. Patient is at home, I am at office. Patient and I are present for visit.   Onset:  2 days ago.  No inj or change in activity.   Location: lower Character:  aching and sharp  Progression of issue:  has worsened Associated symptoms: tingling in RLE Denies bowel/bladder incontinence or weakness Treatment: to date has been OTC NSAIDS.   Neurovascular symptoms: no Msc relaxers tend to work best, she has run out.   ROS: Musculoskeletal/Extremities: +back pain Neurologic: no weakness   Past Medical History:  Diagnosis Date  . Anemia   . Asthma   . Breast cyst     Objective: No conversational dyspnea Age appropriate judgment and insight Nml affect and mood  Assessment:  Low back pain, unspecified back pain laterality, unspecified chronicity, unspecified whether sciatica present - Plan: predniSONE (DELTASONE) 20 MG tablet, traMADol (ULTRAM) 50 MG tablet, cyclobenzaprine (FLEXERIL) 5 MG tablet  Plan: Orders as above. Stretches/exercises, heat, ice, Tylenol, warning for tramadol discussed. F/u prn. The patient voiced understanding and agreement to the plan.   [place], DO 10/01/18  4:13 PM

## 2018-10-01 NOTE — Telephone Encounter (Signed)
Medication Refill - Medication: cyclobenzaprine (FLEXERIL) 5 MG tablet    Preferred Pharmacy (with phone number or street name):  [id number] - [place], [place] [phone] (Phone) [phone] (Fax)     Patient stated she is in a lot of pain and all out of medication

## 2018-10-08 ENCOUNTER — Ambulatory Visit: Payer: BC Managed Care – PPO | Admitting: Family Medicine

## 2018-10-08 DIAGNOSIS — F411 Generalized anxiety disorder: Secondary | ICD-10-CM | POA: Diagnosis not present

## 2018-10-08 DIAGNOSIS — F331 Major depressive disorder, recurrent, moderate: Secondary | ICD-10-CM | POA: Diagnosis not present

## 2018-10-13 DIAGNOSIS — F4323 Adjustment disorder with mixed anxiety and depressed mood: Secondary | ICD-10-CM | POA: Diagnosis not present

## 2018-10-22 DIAGNOSIS — F331 Major depressive disorder, recurrent, moderate: Secondary | ICD-10-CM | POA: Diagnosis not present

## 2018-10-22 DIAGNOSIS — F411 Generalized anxiety disorder: Secondary | ICD-10-CM | POA: Diagnosis not present

## 2018-10-28 ENCOUNTER — Other Ambulatory Visit: Payer: Self-pay

## 2018-10-28 NOTE — Progress Notes (Addendum)
[facility] [address], [address] [place], [place] [address] [phone]  Date:  10/29/2018   Name:  Alison Moss   DOB:  1990-05-27   MRN:  [id number]  PCP:  Alison Cables, MD    Chief Complaint: Muscle Pain (all over body pain, years, worsening)   History of Present Illness:  Alison Moss is a 28 y.o. very pleasant female patient who presents with the following:  Generally healthy young woman here today with concern of muscle and bone pain She saw my partner Dr. Carmelia Roller virtually about a month ago with back pain-he prescribed prednisone, Flexeril, tramadol -she feels like pred did help for a while  She notes that she tends to have lower back pain for the last 2 years  Over the last month or so she had noted some pains in the joints of her UE and LE more generally, bilaterally  Can include shoulders, elbows, wrists, hips, knees, ankles Her knees bother her especially  She feels like her calf muscles are tight and cramping  Her joints may feel stiff  She feels like her knees may swell but otherwise no joint swelling  Her half uncle has RA Her mother has MS There is some history of RA on her mom's side as well  No fevers or rashes   Flu shot-declines  Pap ?  Per GYN  Last BW about one year ago  She is a Architectural technologist, mostly works at a desk She has one 36 yo son- pre K this year and K next year   Patient Active Problem List   Diagnosis Date Noted  . Vegan diet 11/08/2015  . Pre-menstrual syndrome 11/08/2015    Past Medical History:  Diagnosis Date  . Anemia   . Asthma   . Breast cyst     Past Surgical History:  Procedure Laterality Date  . BREAST BIOPSY Left   . NO PAST SURGERIES      Social History   Tobacco Use  . Smoking status: Never Smoker  . Smokeless tobacco: Never Used  Substance Use Topics  . Alcohol use: No  . Drug use: No    Family History  Problem Relation  Age of Onset  . Multiple sclerosis Mother     Allergies  Allergen Reactions  . Iodine Hives  . Shellfish Allergy Hives    Medication list has been reviewed and updated.  Current Outpatient Medications on File Prior to Visit  Medication Sig Dispense Refill  . albuterol (PROVENTIL HFA;VENTOLIN HFA) 108 (90 Base) MCG/ACT inhaler Inhale 2 puffs into the lungs every 6 (six) hours as needed for wheezing or shortness of breath. 1 Inhaler 2  . citalopram (CELEXA) 40 MG tablet Take 1 tablet (40 mg total) by mouth daily. 90 tablet 3  . cyclobenzaprine (FLEXERIL) 5 MG tablet Take 1 tablet (5 mg total) by mouth at bedtime. Use as needed for muscle spasm and back pain 30 tablet 0  . fluticasone (FLONASE) 50 MCG/ACT nasal spray Place 2 sprays into both nostrils daily. 16 g 1   No current facility-administered medications on file prior to visit.     Review of Systems:  As per HPI- otherwise negative.   Physical Examination: Vitals:   10/29/18 1040  BP: 112/68  Pulse: 82  Resp: 16  Temp: 98.2 F (36.8 C)   Vitals:   10/29/18 1040  Weight: 141 lb (64 kg)  Height: 5' (  1.524 m)   Body mass index is 27.54 kg/m. Ideal Body Weight: Weight in (lb) to have BMI = 25: 127.7  GEN: WDWN, NAD, Non-toxic, A & O x 3, mild overweight, looks well  HEENT: Atraumatic, Normocephalic. Neck supple. No masses, No LAD. Ears and Nose: No external deformity. CV: RRR, No M/G/R. No JVD. No thrill. No extra heart sounds. PULM: CTA B, no wheezes, crackles, rhonchi. No retractions. No resp. distress. No accessory muscle use. ABD: S, NT, ND EXTR: No c/c/e NEURO Normal gait.  PSYCH: Normally interactive. Conversant. Not depressed or anxious appearing.  Calm demeanor.  Joint exam today in the office reveals possible mild stiffness of the shoulders and elbows, otherwise seems normal No heat or joint effusions is noted   Assessment and Plan: Screening for hyperlipidemia - Plan: Lipid panel  Screening for  deficiency anemia - Plan: CBC  Screening for diabetes mellitus - Plan: Comprehensive metabolic panel, Hemoglobin A1c  Screening for thyroid disorder - Plan: TSH  Arthralgia, unspecified joint - Plan: Sedimentation rate, C-reactive protein, Rheumatoid Factor, ANA, meloxicam (MOBIC) 15 MG tablet  Joint stiffness - Plan: Sedimentation rate, C-reactive protein, Rheumatoid Factor, ANA  Vitamin D deficiency - Plan: Vitamin D (25 hydroxy)  Routine screening labs done today Declines a flu shot 4-6 weeks of various joint pains, and also her mother has history of MS Labs pending as above:  If we find evidence of an auto-immune disorder will refer to rheumatology If not will plan to refer you to neurology to discuss possible MS Let's try meloxicam daily as needed for joint pains    Signed Lamar Blinks, MD  Received her labs 10/16- just ANA is pending  Message to pt Called pt and let her know that vitamin D rx could contain fish; she will make sure the product she receives is fish free.  She wonders if ok to delay neurology visit until we replete her vitamin D due to cost- I think this is ok.  She will keep me posted   Results for orders placed or performed in visit on 10/29/18  CBC  Result Value Ref Range   WBC 5.0 4.0 - 10.5 K/uL   RBC 4.19 3.87 - 5.11 Mil/uL   Platelets 243.0 150.0 - 400.0 K/uL   Hemoglobin 12.6 12.0 - 15.0 g/dL   HCT 38.0 36.0 - 46.0 %   MCV 90.6 78.0 - 100.0 fl   MCHC 33.2 30.0 - 36.0 g/dL   RDW 13.7 11.5 - 15.5 %  Comprehensive metabolic panel  Result Value Ref Range   Sodium 138 135 - 145 mEq/L   Potassium 4.2 3.5 - 5.1 mEq/L   Chloride 104 96 - 112 mEq/L   CO2 27 19 - 32 mEq/L   Glucose, Bld 78 70 - 99 mg/dL   BUN 5 (L) 6 - 23 mg/dL   Creatinine, Ser 0.57 0.40 - 1.20 mg/dL   Total Bilirubin 1.4 (H) 0.2 - 1.2 mg/dL   Alkaline Phosphatase 102 39 - 117 U/L   AST 14 0 - 37 U/L   ALT 12 0 - 35 U/L   Total Protein 7.2 6.0 - 8.3 g/dL   Albumin 4.8 3.5 -  5.2 g/dL   Calcium 9.5 8.4 - 10.5 mg/dL   GFR 125.79 >60.00 mL/min  Hemoglobin A1c  Result Value Ref Range   Hgb A1c MFr Bld 5.4 4.6 - 6.5 %  Lipid panel  Result Value Ref Range   Cholesterol 131 0 - 200 mg/dL  Triglycerides 94.0 0.0 - 149.0 mg/dL   HDL 16.1047.30 >96.04>39.00 mg/dL   VLDL 54.018.8 0.0 - 98.140.0 mg/dL   LDL Cholesterol 65 0 - 99 mg/dL   Total CHOL/HDL Ratio 3    NonHDL 84.18   TSH  Result Value Ref Range   TSH 1.90 0.35 - 4.50 uIU/mL  Sedimentation rate  Result Value Ref Range   Sed Rate 2 0 - 20 mm/hr  Vitamin D (25 hydroxy)  Result Value Ref Range   VITD 16.64 (L) 30.00 - 100.00 ng/mL  C-reactive protein  Result Value Ref Range   CRP <1.0 0.5 - 20.0 mg/dL  Rheumatoid Factor  Result Value Ref Range   Rhuematoid fact SerPl-aCnc <14 <14 IU/mL   Blood counts normal Metabolic profile looks fine except for a mild elevation of your bilirubin.  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  I tried to add on a fractionated bilirubin to confirm this, but they were not able to add it to the blood in the lab.  We can do this at your convenience A1c-average blood sugar the previous 3 months-normal Cholesterol looks good Thyroid normal Your sedimentation rate and CRP are both normal, rheumatoid factor negative.  I am still waiting on your ANA, but the negative CRP and sedimentation rate make an autoimmune disorder less likely  Your vitamin D is low, I will prescribe a high-dose weekly supplement for you to take for 12 weeks.  When you finish this, please take 2000 international units by mouth daily  At this point I think an autoimmune disorder is less likely, we might consider referring you to neurology for consultation.  I will go ahead and place this order, let me know if you have any questions

## 2018-10-29 ENCOUNTER — Ambulatory Visit: Payer: BC Managed Care – PPO | Admitting: Family Medicine

## 2018-10-29 ENCOUNTER — Encounter: Payer: Self-pay | Admitting: Family Medicine

## 2018-10-29 VITALS — BP 112/68 | HR 82 | Temp 98.2°F | Resp 16 | Ht 60.0 in | Wt 141.0 lb

## 2018-10-29 DIAGNOSIS — Z131 Encounter for screening for diabetes mellitus: Secondary | ICD-10-CM

## 2018-10-29 DIAGNOSIS — E559 Vitamin D deficiency, unspecified: Secondary | ICD-10-CM

## 2018-10-29 DIAGNOSIS — Z1329 Encounter for screening for other suspected endocrine disorder: Secondary | ICD-10-CM | POA: Diagnosis not present

## 2018-10-29 DIAGNOSIS — Z820 Family history of epilepsy and other diseases of the nervous system: Secondary | ICD-10-CM

## 2018-10-29 DIAGNOSIS — Z130 Encounter for screening for diseases of the blood and blood-forming organs and certain disorders involving the immune mechanism: Secondary | ICD-10-CM | POA: Diagnosis not present

## 2018-10-29 DIAGNOSIS — M2550 Pain in unspecified joint: Secondary | ICD-10-CM | POA: Diagnosis not present

## 2018-10-29 DIAGNOSIS — Z13220 Encounter for screening for lipoid disorders: Secondary | ICD-10-CM | POA: Diagnosis not present

## 2018-10-29 DIAGNOSIS — M2560 Stiffness of unspecified joint, not elsewhere classified: Secondary | ICD-10-CM | POA: Diagnosis not present

## 2018-10-29 LAB — LIPID PANEL
Cholesterol: 131 mg/dL (ref 0–200)
HDL: 47.3 mg/dL (ref >39.00)
LDL Cholesterol: 65 mg/dL (ref 0–99)
NonHDL: 84.18
Total CHOL/HDL Ratio: 3
Triglycerides: 94 mg/dL (ref 0.0–149.0)
VLDL: 18.8 mg/dL (ref 0.0–40.0)

## 2018-10-29 LAB — CBC
HCT: 38 % (ref 36.0–46.0)
Hemoglobin: 12.6 g/dL (ref 12.0–15.0)
MCHC: 33.2 g/dL (ref 30.0–36.0)
MCV: 90.6 fl (ref 78.0–100.0)
Platelets: 243 10*3/uL (ref 150.0–400.0)
RBC: 4.19 Mil/uL (ref 3.87–5.11)
RDW: 13.7 % (ref 11.5–15.5)
WBC: 5 10*3/uL (ref 4.0–10.5)

## 2018-10-29 LAB — HEMOGLOBIN A1C: Hgb A1c MFr Bld: 5.4 % (ref 4.6–6.5)

## 2018-10-29 LAB — C-REACTIVE PROTEIN: CRP: <1 mg/dL (ref 0.5–20.0)

## 2018-10-29 LAB — TSH: TSH: 1.9 u[IU]/mL (ref 0.35–4.50)

## 2018-10-29 LAB — COMPREHENSIVE METABOLIC PANEL
ALT: 12 U/L (ref 0–35)
AST: 14 U/L (ref 0–37)
Albumin: 4.8 g/dL (ref 3.5–5.2)
Alkaline Phosphatase: 102 U/L (ref 39–117)
BUN: 5 mg/dL — ABNORMAL LOW (ref 6–23)
CO2: 27 mEq/L (ref 19–32)
Calcium: 9.5 mg/dL (ref 8.4–10.5)
Chloride: 104 mEq/L (ref 96–112)
Creatinine, Ser: 0.57 mg/dL (ref 0.40–1.20)
GFR: 125.79 mL/min (ref >60.00)
Glucose, Bld: 78 mg/dL (ref 70–99)
Potassium: 4.2 mEq/L (ref 3.5–5.1)
Sodium: 138 mEq/L (ref 135–145)
Total Bilirubin: 1.4 mg/dL — ABNORMAL HIGH (ref 0.2–1.2)
Total Protein: 7.2 g/dL (ref 6.0–8.3)

## 2018-10-29 LAB — VITAMIN D 25 HYDROXY (VIT D DEFICIENCY, FRACTURES): VITD: 16.64 ng/mL — ABNORMAL LOW (ref 30.00–100.00)

## 2018-10-29 LAB — SEDIMENTATION RATE: Sed Rate: 2 mm/hr (ref 0–20)

## 2018-10-29 MED ORDER — MELOXICAM 15 MG PO TABS: ORAL_TABLET | ORAL | 2 refills | Status: DC

## 2018-10-29 NOTE — Patient Instructions (Addendum)
Good to see you today- I will be in touch with your labs asap, we will see what we can figure out for you!   If we find evidence of an auto-immune disorder will refer to rheumatology If not will plan to refer you to neurology to discuss possible MS Let's try meloxicam daily as needed for joint pains

## 2018-10-30 ENCOUNTER — Encounter: Payer: Self-pay | Admitting: Family Medicine

## 2018-10-30 MED ORDER — VITAMIN D3 1.25 MG (50000 UT) PO CAPS: ORAL_CAPSULE | ORAL | 0 refills | Status: DC

## 2018-10-30 NOTE — Addendum Note (Signed)
Addended by: Lamar Blinks C on: 10/30/2018 08:07 AM   Modules accepted: Orders

## 2018-10-31 LAB — ANA: Anti Nuclear Antibody (ANA): NEGATIVE

## 2018-10-31 LAB — RHEUMATOID FACTOR: Rhuematoid fact SerPl-aCnc: <14 IU/mL (ref <14)

## 2018-11-02 DIAGNOSIS — F4323 Adjustment disorder with mixed anxiety and depressed mood: Secondary | ICD-10-CM | POA: Diagnosis not present

## 2018-11-04 ENCOUNTER — Encounter: Payer: BC Managed Care – PPO | Admitting: Family Medicine

## 2018-11-26 ENCOUNTER — Other Ambulatory Visit: Payer: Self-pay

## 2018-11-26 ENCOUNTER — Encounter: Payer: Self-pay | Admitting: Neurology

## 2018-11-26 ENCOUNTER — Ambulatory Visit: Payer: BC Managed Care – PPO | Admitting: Neurology

## 2018-11-26 VITALS — BP 109/79 | HR 76 | Temp 97.5°F | Ht 60.0 in | Wt 143.0 lb

## 2018-11-26 DIAGNOSIS — R202 Paresthesia of skin: Secondary | ICD-10-CM

## 2018-11-26 DIAGNOSIS — M791 Myalgia, unspecified site: Secondary | ICD-10-CM | POA: Diagnosis not present

## 2018-11-26 MED ORDER — GABAPENTIN 100 MG PO CAPS: 100.0000 mg | ORAL_CAPSULE | Freq: Three times a day (TID) | ORAL | 3 refills | Status: DC

## 2018-11-26 MED ORDER — ALPRAZOLAM 0.5 MG PO TABS: ORAL_TABLET | ORAL | 0 refills | Status: DC

## 2018-11-26 NOTE — Patient Instructions (Signed)
We will start gabapentin for the back pain.  Neurontin (gabapentin) may result in drowsiness, ankle swelling, gait instability, or possibly dizziness. Please contact our office if significant side effects occur with this medication.  

## 2018-11-26 NOTE — Progress Notes (Signed)
Reason for visit: Low back pain, paresthesias  Referring physician: Dr. Jean Rosenthal Alison Moss is a 28 y.o. female  History of present illness:  Alison Moss is a 28 year old right-handed white female with a longstanding history of low back pain dating back 7 to 8 years.  The patient notes a spontaneous onset of the back pain that occasionally may be associated with some sciatica type pain down the left leg.  The patient has been evaluated through orthopedic surgery with [facility], she claims that she had MRI of the lumbar spine done and that this showed no etiology for her back pain.  The actual report is not available to me.  The patient was set up for some physical therapy on the low back which did help, but the patient does not maintain a regular schedule of exercise or stretching.  The patient has a myriad of other symptoms.  She apparently has had chronic issues with low vitamin D levels, she currently is on high-dose therapy.  The patient reports relatively frequent but mild headache, she denies any vision changes.  She has chronic insomnia and chronic fatigue.  When the low back pain gets severe, she may have some paresthesias going from the hip level on the left down to the foot and from the knee level on the right down to the foot.  The episodes of flareups may last 2 days and then resolve.  The patient also reports some neck and shoulder discomfort with some intermittent tingling in the hands as well when the neck flares up.  The patient reports diffuse joint and bone pain.  She denies any issues controlling the bowels or the bladder.  She does report some mild gait instability, she may stumble on occasion.  She has in the past been on nonsteroidal anti-inflammatory medication without much benefit.  She has been on transient course of gabapentin which allowed her to rest better, but she was only on this for a short period of time.  The patient claims that her mother has multiple  sclerosis, she is concerned about this diagnosis.  She has had recent blood work that included a rheumatoid factor, ANA, thyroid profile, and vitamin D level.  The vitamin D again remains low.  The other testing procedures were unremarkable.  She is sent to this office for an evaluation.  She claims that she is not under any emotional stress at this time.  Past Medical History:  Diagnosis Date   Anemia    Asthma    Breast cyst     Past Surgical History:  Procedure Laterality Date   BREAST BIOPSY Left    NO PAST SURGERIES      Family History  Problem Relation Age of Onset   Multiple sclerosis Mother     Social history:  reports that she has never smoked. She has never used smokeless tobacco. She reports that she does not drink alcohol or use drugs.  Medications:  Prior to Admission medications   Medication Sig Start Date End Date Taking? Authorizing Provider  Cholecalciferol (VITAMIN D3) 1.25 MG (50000 UT) CAPS Take 1 weekly for 12 weeks 10/30/18  Yes Copland, Gay Filler, MD  citalopram (CELEXA) 40 MG tablet Take 1 tablet (40 mg total) by mouth daily. 07/06/18  Yes Copland, Gay Filler, MD  cyclobenzaprine (FLEXERIL) 5 MG tablet Take 1 tablet (5 mg total) by mouth at bedtime. Use as needed for muscle spasm and back pain 10/01/18  Yes Wendling, Crosby Oyster, DO  fluticasone (FLONASE) 50 MCG/ACT nasal spray Place 2 sprays into both nostrils daily. 02/24/18  Yes Saguier, Percell Miller, PA-C  meloxicam (MOBIC) 15 MG tablet Take 1/2 or 1 daily as needed for joint pains 10/29/18  Yes Copland, Gay Filler, MD      Allergies  Allergen Reactions   Iodine Hives   Shellfish Allergy Hives    ROS:  Out of a complete 14 system review of symptoms, the patient complains only of the following symptoms, and all other reviewed systems are negative.  Fatigue Dizziness Easy bruising Joint pain, joint swelling, aching muscles Allergies Numbness, weakness Depression, anxiety, decreased  energy Insomnia  Blood pressure 109/79, pulse 76, temperature (!) 97.5 F (36.4 C), height 5' (1.524 m), weight 143 lb (64.9 kg), unknown if currently breastfeeding.  Physical Exam  General: The patient is alert and cooperative at the time of the examination.  Eyes: Pupils are equal, round, and reactive to light. Discs are flat bilaterally.  Neck: The neck is supple, no carotid bruits are noted.  Respiratory: The respiratory examination is clear.  Cardiovascular: The cardiovascular examination reveals a regular rate and rhythm, no obvious murmurs or rubs are noted.  Neuromuscular: Range of movement of the cervical and lumbar spine are normal.  Skin: Extremities are without significant edema.  Neurologic Exam  Mental status: The patient is alert and oriented x 3 at the time of the examination. The patient has apparent normal recent and remote memory, with an apparently normal attention span and concentration ability.  Cranial nerves: Facial symmetry is present. There is good sensation of the face to pinprick and soft touch bilaterally. The strength of the facial muscles and the muscles to head turning and shoulder shrug are normal bilaterally. Speech is well enunciated, no aphasia or dysarthria is noted. Extraocular movements are full. Visual fields are full. The tongue is midline, and the patient has symmetric elevation of the soft palate. No obvious hearing deficits are noted.  Motor: The motor testing reveals 5 over 5 strength of all 4 extremities. Good symmetric motor tone is noted throughout.  Sensory: Sensory testing is intact to pinprick, soft touch, vibration sensation, and position sense on all 4 extremities. No evidence of extinction is noted.  Coordination: Cerebellar testing reveals good finger-nose-finger and heel-to-shin bilaterally.  Gait and station: Gait is normal. Tandem gait is normal. Romberg is negative. No drift is seen.  Reflexes: Deep tendon reflexes are  symmetric and normal bilaterally. Toes are downgoing bilaterally.   Assessment/Plan:  1.  Diffuse neuromuscular discomfort, low back pain  2.  Intermittent paresthesias, legs and hands  3.  Chronic insomnia, fatigue  The patient has diffuse nonspecific complaints of bone pain, muscle pain, joint pain, and back pain.  She has intermittent paresthesias associated with flareups of the pain.  The possibility of a fibromyalgia syndrome should be considered.  The neurologic examination is completely normal.  The patient is concerned about the possibility of multiple sclerosis as her mother has this diagnosis, but it is likely that the patient does not have this disease entity.  Having said this, we will check MRI of the brain, we will pursue further blood work, I have recommended that the patient get into a regular routine requiring stretching to include yoga or Pilates.  The patient was placed on low-dose gabapentin 100 mg 3 times daily, she will call for any dose adjustments.  She will follow-up here in about 6 months.  Jill Alexanders MD 11/26/2018 7:41 AM  [facility] [address] [place], [place] [address]  Phone [phone] Fax [phone]

## 2018-11-28 LAB — B. BURGDORFI ANTIBODIES: Lyme IgG/IgM Ab: <0.91 {ISR} (ref 0.00–0.90)

## 2018-11-28 LAB — ANGIOTENSIN CONVERTING ENZYME: Angio Convert Enzyme: 30 U/L (ref 14–82)

## 2018-11-28 LAB — CK: Total CK: 50 U/L (ref 32–182)

## 2018-11-28 LAB — COPPER, SERUM: Copper: 87 ug/dL (ref 72–166)

## 2018-12-01 ENCOUNTER — Telehealth: Payer: Self-pay

## 2018-12-01 NOTE — Telephone Encounter (Signed)
MRI approved. [employer] Auth: [id number]. I called and spoke with patient and reviewed benefits. Patient states that she will discuss with her husband and call us back when ready to schedule.

## 2019-01-17 ENCOUNTER — Other Ambulatory Visit: Payer: Self-pay | Admitting: Family Medicine

## 2019-01-17 DIAGNOSIS — F329 Major depressive disorder, single episode, unspecified: Secondary | ICD-10-CM

## 2019-01-17 DIAGNOSIS — F439 Reaction to severe stress, unspecified: Secondary | ICD-10-CM

## 2019-01-17 DIAGNOSIS — F32A Depression, unspecified: Secondary | ICD-10-CM

## 2019-01-19 ENCOUNTER — Other Ambulatory Visit: Payer: Self-pay | Admitting: Family Medicine

## 2019-01-19 DIAGNOSIS — E559 Vitamin D deficiency, unspecified: Secondary | ICD-10-CM

## 2019-03-16 IMAGING — DX DG LUMBAR SPINE COMPLETE 4+V
5 series · 5 of 5 positions shown · non-contrast
Comparison: None.

CLINICAL DATA: 27-year-old female with low back pain. Initial
encounter.

EXAM:
LUMBAR SPINE - COMPLETE 4+ VIEW

[lumbar spine ap]
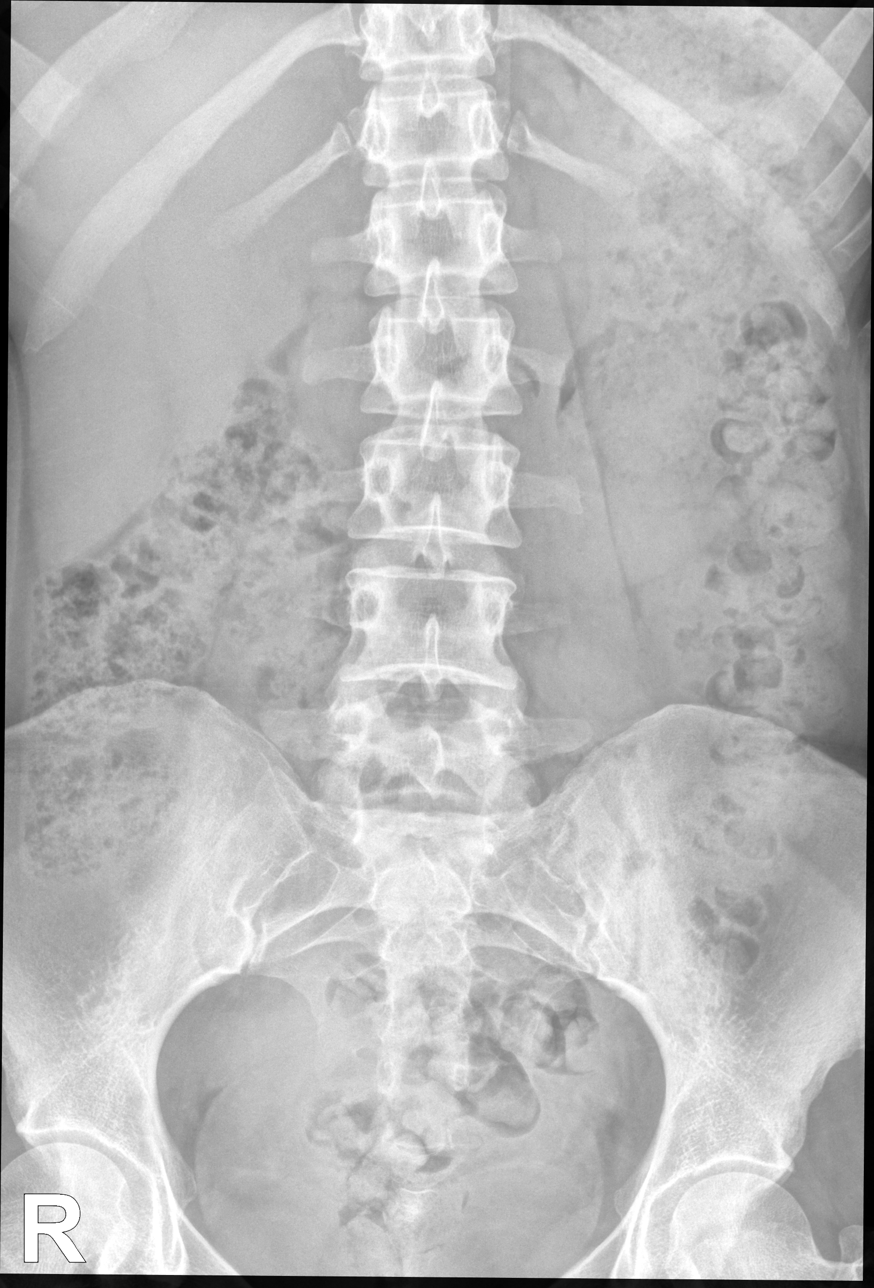

[lumbar spine oblique (1 of 2)]
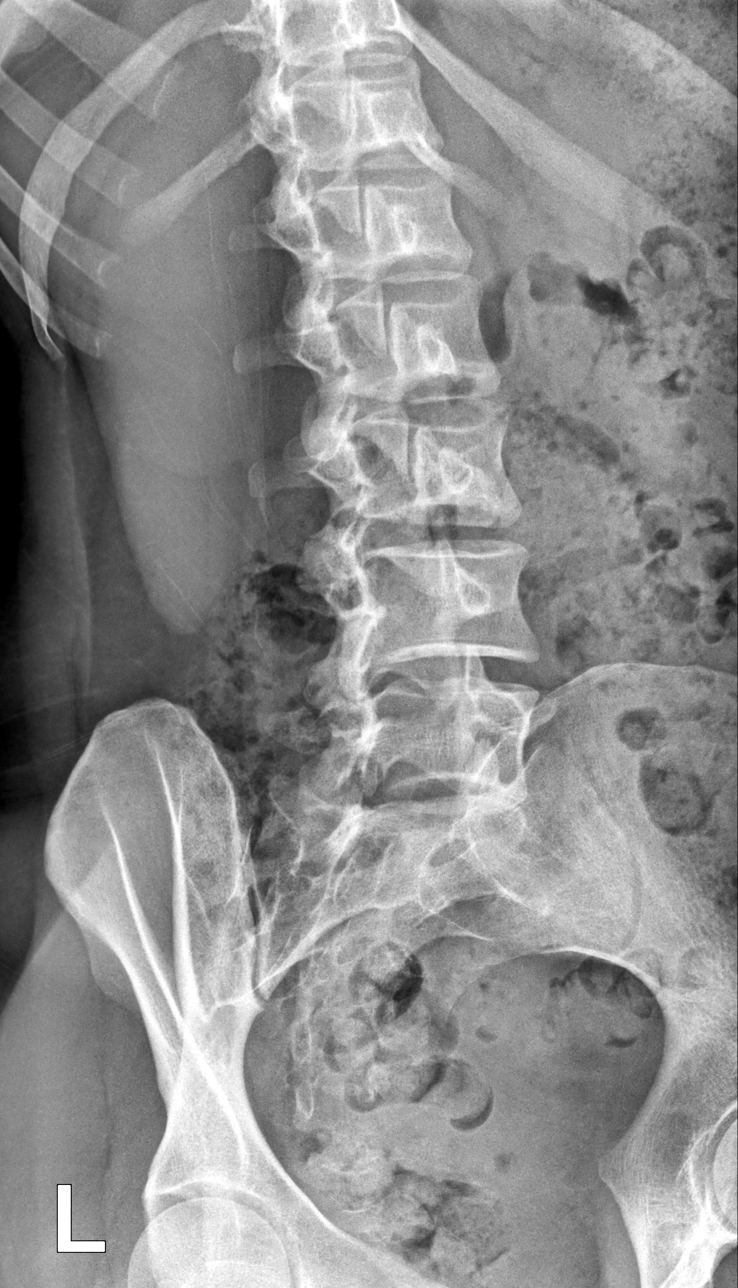

[lumbar spine oblique (2 of 2)]
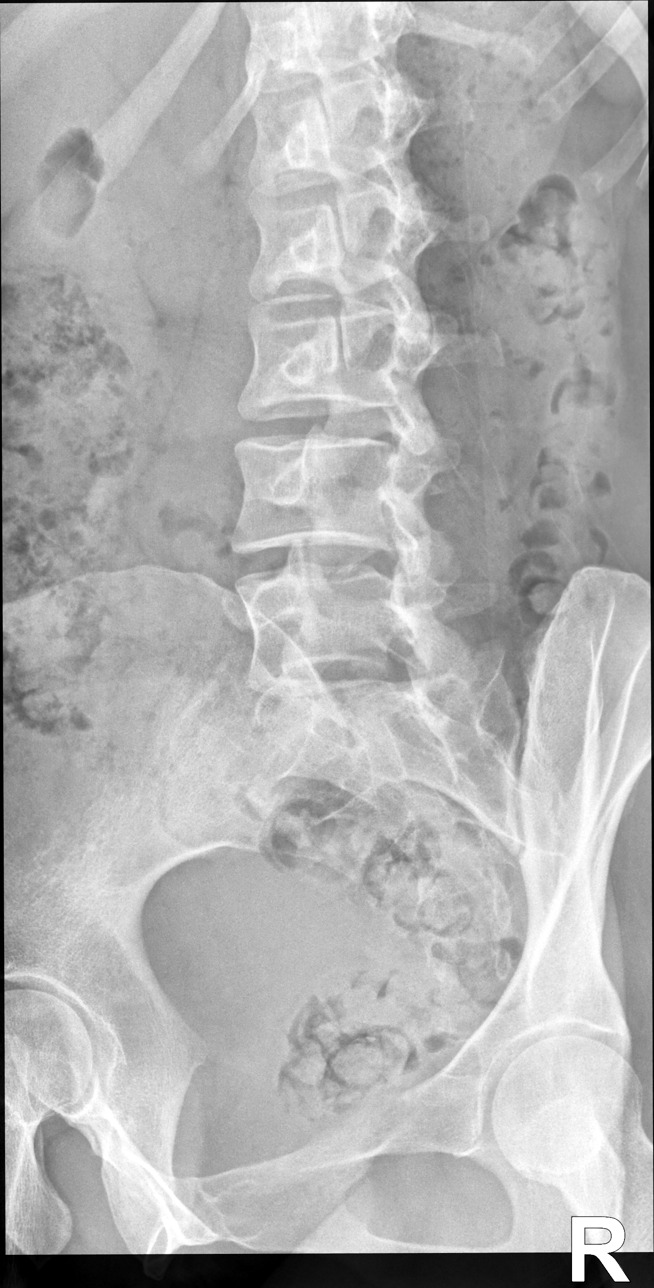

[lumbar spine lat (1 of 2)]
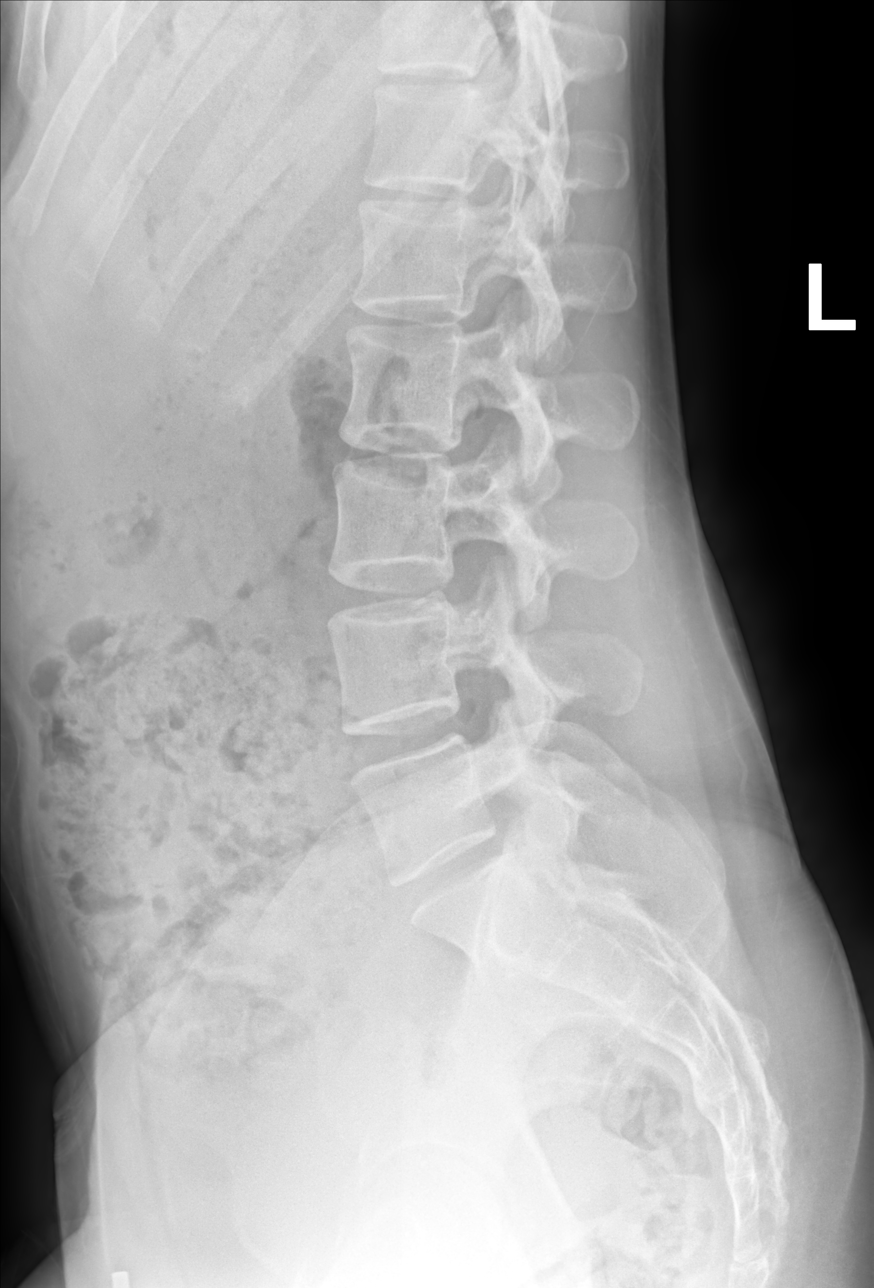

[lumbar spine lat (2 of 2)]
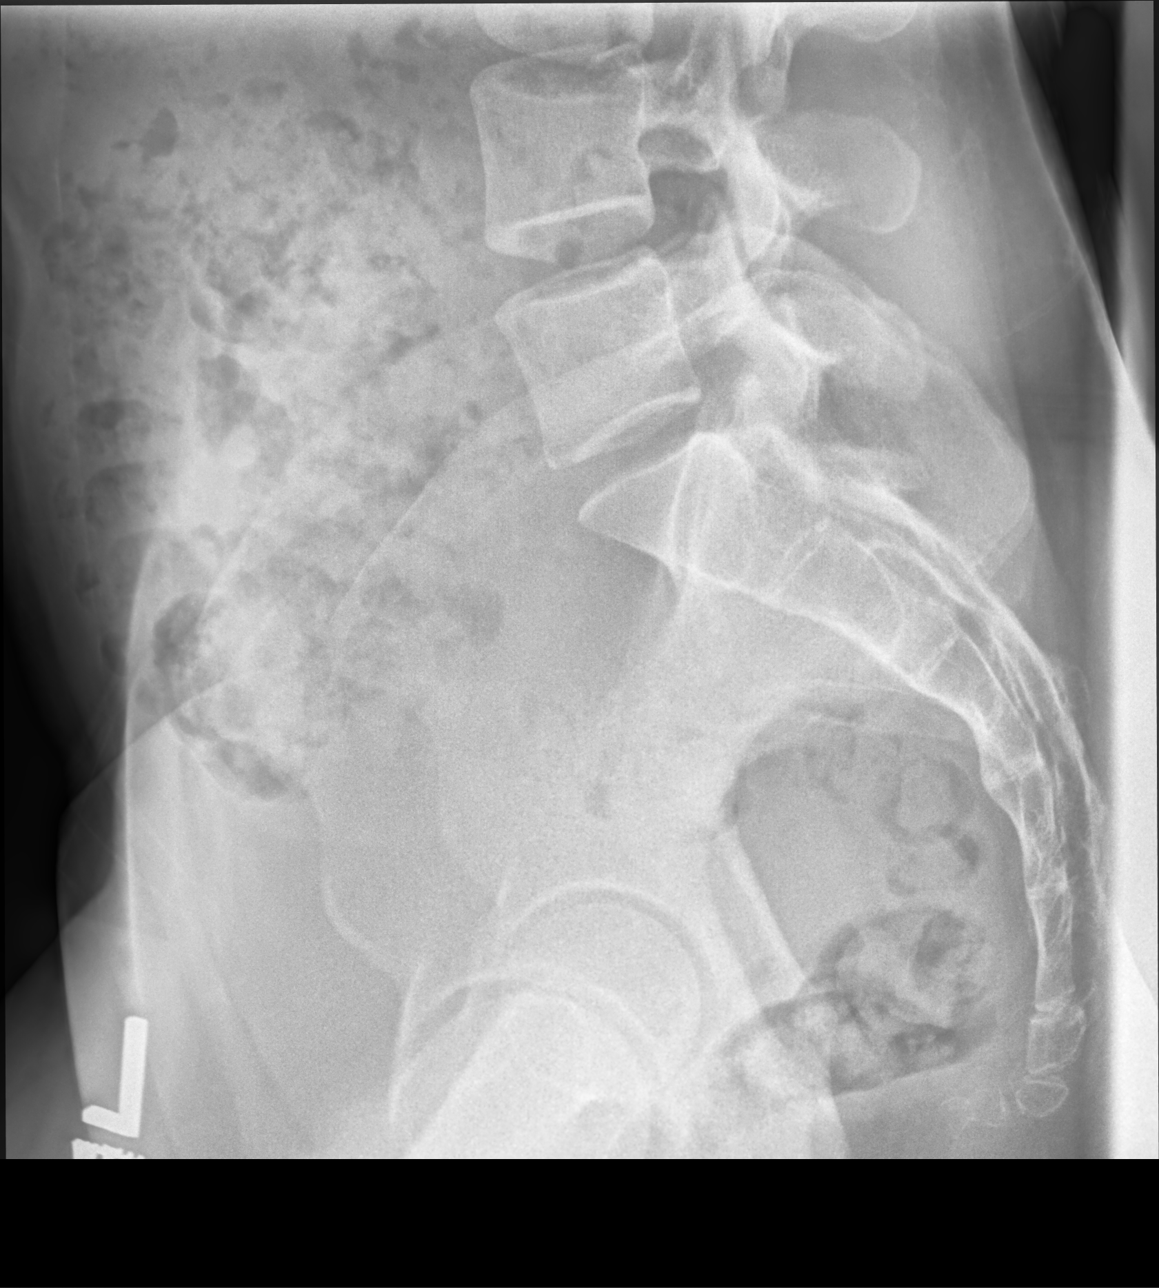

[5 of 5 positions shown; findings below may reference images not displayed]

FINDINGS: Minimal curvature lumbar spine possibly positional. No compression
fracture, pars defect or significant disc space narrowing.
Sacroiliac joints appear intact. Visualized aspect of the hip joints
unremarkable. Moderate stool throughout the colon.
IMPRESSION: Negative.

## 2019-05-26 ENCOUNTER — Ambulatory Visit: Payer: Self-pay | Admitting: Neurology

## 2021-05-25 DIAGNOSIS — Z3201 Encounter for pregnancy test, result positive: Secondary | ICD-10-CM | POA: Diagnosis not present

## 2021-05-25 DIAGNOSIS — Z3200 Encounter for pregnancy test, result unknown: Secondary | ICD-10-CM | POA: Diagnosis not present

## 2021-05-25 DIAGNOSIS — Z3689 Encounter for other specified antenatal screening: Secondary | ICD-10-CM | POA: Diagnosis not present

## 2021-06-05 DIAGNOSIS — Z3201 Encounter for pregnancy test, result positive: Secondary | ICD-10-CM | POA: Diagnosis not present

## 2021-06-07 DIAGNOSIS — O26859 Spotting complicating pregnancy, unspecified trimester: Secondary | ICD-10-CM | POA: Diagnosis not present

## 2021-06-08 DIAGNOSIS — O021 Missed abortion: Secondary | ICD-10-CM | POA: Diagnosis not present

## 2021-06-14 DIAGNOSIS — O021 Missed abortion: Secondary | ICD-10-CM | POA: Diagnosis not present

## 2021-06-14 DIAGNOSIS — O039 Complete or unspecified spontaneous abortion without complication: Secondary | ICD-10-CM | POA: Diagnosis not present

## 2021-06-14 HISTORY — PX: DILATION AND CURETTAGE OF UTERUS: SHX78

## 2021-07-01 DIAGNOSIS — J069 Acute upper respiratory infection, unspecified: Secondary | ICD-10-CM | POA: Diagnosis not present

## 2021-07-01 DIAGNOSIS — J060 Acute laryngopharyngitis: Secondary | ICD-10-CM | POA: Diagnosis not present

## 2021-07-01 DIAGNOSIS — R070 Pain in throat: Secondary | ICD-10-CM | POA: Diagnosis not present

## 2021-07-01 DIAGNOSIS — R059 Cough, unspecified: Secondary | ICD-10-CM | POA: Diagnosis not present

## 2021-07-01 DIAGNOSIS — J020 Streptococcal pharyngitis: Secondary | ICD-10-CM | POA: Diagnosis not present

## 2021-07-01 DIAGNOSIS — R509 Fever, unspecified: Secondary | ICD-10-CM | POA: Diagnosis not present

## 2021-07-01 DIAGNOSIS — J029 Acute pharyngitis, unspecified: Secondary | ICD-10-CM | POA: Diagnosis not present

## 2021-07-19 DIAGNOSIS — O021 Missed abortion: Secondary | ICD-10-CM | POA: Diagnosis not present

## 2021-07-19 DIAGNOSIS — Z09 Encounter for follow-up examination after completed treatment for conditions other than malignant neoplasm: Secondary | ICD-10-CM | POA: Diagnosis not present

## 2021-12-04 DIAGNOSIS — Z3689 Encounter for other specified antenatal screening: Secondary | ICD-10-CM | POA: Diagnosis not present

## 2021-12-04 DIAGNOSIS — Z3200 Encounter for pregnancy test, result unknown: Secondary | ICD-10-CM | POA: Diagnosis not present

## 2021-12-10 DIAGNOSIS — N96 Recurrent pregnancy loss: Secondary | ICD-10-CM | POA: Diagnosis not present

## 2021-12-18 DIAGNOSIS — Z3201 Encounter for pregnancy test, result positive: Secondary | ICD-10-CM | POA: Diagnosis not present

## 2022-01-02 DIAGNOSIS — Z3201 Encounter for pregnancy test, result positive: Secondary | ICD-10-CM | POA: Diagnosis not present

## 2022-01-02 DIAGNOSIS — B3731 Acute candidiasis of vulva and vagina: Secondary | ICD-10-CM | POA: Diagnosis not present

## 2022-01-02 DIAGNOSIS — Z23 Encounter for immunization: Secondary | ICD-10-CM | POA: Diagnosis not present

## 2022-01-02 DIAGNOSIS — N898 Other specified noninflammatory disorders of vagina: Secondary | ICD-10-CM | POA: Diagnosis not present

## 2022-01-14 NOTE — L&D Delivery Note (Signed)
      Delivery Note:   [id number] at [redacted]w[redacted]d  Admitting diagnosis: Normal labor [O80, Z37.9] Risks: Rh neg, spouse Rh neg, declined Rhogam Hx PTL at 33 wks, delivered 39 wks Mild anemia on oral Fe Anxiety, off Celexa in pregnancy and stable  First Stage:  Induction of labor:elective, outpatient foley Onset of labor: 08/08/2022 0700 Augmentation: AROM, Pitocin, and OP Foley ROM: 08/08/2022 1630 clear fluid Active labor onset: 2000 Hydrotherapy: x 2 hrs intermittent Analgesia /Anesthesia/Pain control intrapartum: Epidural  Second Stage:  Complete dilation at 08/09/2022 0252 Onset of pushing at 0255 FHR second stage Category 2, variables   Pushing in L and R lateral position with CNM and L&D staff support, Raiford Noble and Seward Grater present for birth and supportive. Nuchal Cord: No  Delivery of a Live born female  Birth Weight:  pending APGAR: 8, 9  Newborn Delivery   Birth date/time: 08/09/2022 03:38:00 Delivery type:      in cephalic presentation, position OA to ROA. Shoulders and body delivered slowly, into Rick's hands, dad brought Alison up to mom's arms. Newborn with spontaneous cry, vigorous tone, dried and stimulated, placed on maternal abdomen.  Cord double clamped after cessation of pulsation, cut by Raiford Noble.  Collection of cord blood for typing completed. Cord blood donation-None Arterial cord blood sample-No   Third Stage:  Placenta delivered 08/09/2022 at 0343-  with 3 vessels. Partially separated, manually removed from LUS with trailing membranes, appears intact. Uterine tone firm, bleeding small. Uterotonics: Pitocin IV bolus Placenta to parents per request.  No laceration identified.  Episiotomy:None Local analgesia: NA  Repair:NA Est. Blood Loss (mL):400 Complications: None   Mom to postpartum.  Alison Moss to Couplet care / Skin to Skin.  Delivery Report:  Review the Delivery Report for details.     Signed: Neta Mends, CNM, MSN 08/09/2022, 4:02  AM

## 2022-01-21 DIAGNOSIS — Z118 Encounter for screening for other infectious and parasitic diseases: Secondary | ICD-10-CM | POA: Diagnosis not present

## 2022-01-21 DIAGNOSIS — Z3689 Encounter for other specified antenatal screening: Secondary | ICD-10-CM | POA: Diagnosis not present

## 2022-01-21 DIAGNOSIS — Z3481 Encounter for supervision of other normal pregnancy, first trimester: Secondary | ICD-10-CM | POA: Diagnosis not present

## 2022-03-20 DIAGNOSIS — Z361 Encounter for antenatal screening for raised alphafetoprotein level: Secondary | ICD-10-CM | POA: Diagnosis not present

## 2022-03-20 DIAGNOSIS — Z363 Encounter for antenatal screening for malformations: Secondary | ICD-10-CM | POA: Diagnosis not present

## 2022-04-29 DIAGNOSIS — N898 Other specified noninflammatory disorders of vagina: Secondary | ICD-10-CM | POA: Diagnosis not present

## 2022-05-09 ENCOUNTER — Encounter (HOSPITAL_COMMUNITY): Payer: Self-pay | Admitting: *Deleted

## 2022-05-09 ENCOUNTER — Inpatient Hospital Stay (HOSPITAL_COMMUNITY)
Admission: AD | Admit: 2022-05-09 | Discharge: 2022-05-09 | Disposition: A | Discharge status: Home / Self Care | Payer: BC Managed Care – PPO | Attending: Obstetrics and Gynecology | Admitting: Obstetrics and Gynecology

## 2022-05-09 DIAGNOSIS — O09212 Supervision of pregnancy with history of pre-term labor, second trimester: Secondary | ICD-10-CM | POA: Insufficient documentation

## 2022-05-09 DIAGNOSIS — Z3A26 26 weeks gestation of pregnancy: Secondary | ICD-10-CM

## 2022-05-09 DIAGNOSIS — O4700 False labor before 37 completed weeks of gestation, unspecified trimester: Secondary | ICD-10-CM

## 2022-05-09 DIAGNOSIS — O4702 False labor before 37 completed weeks of gestation, second trimester: Secondary | ICD-10-CM | POA: Diagnosis not present

## 2022-05-09 LAB — URINALYSIS, ROUTINE W REFLEX MICROSCOPIC
Bilirubin Urine: NEGATIVE
Glucose, UA: NEGATIVE mg/dL
Hgb urine dipstick: NEGATIVE
Ketones, ur: 15 mg/dL — AB
Leukocytes,Ua: NEGATIVE
Nitrite: NEGATIVE
Protein, ur: NEGATIVE mg/dL
Specific Gravity, Urine: 1.01 (ref 1.005–1.030)
pH: 7.5 (ref 5.0–8.0)

## 2022-05-09 LAB — FETAL FIBRONECTIN: Fetal Fibronectin: NEGATIVE

## 2022-05-09 MED ORDER — LACTATED RINGERS IV BOLUS
1000.0000 mL | Freq: Once | INTRAVENOUS | Status: AC
  Administered 2022-05-09: 1000 mL via INTRAVENOUS

## 2022-05-09 NOTE — MAU Provider Note (Addendum)
History   Chief Complaint  Patient presents with   Contractions   HPI  Alison Moss is a 32 y/o at [redacted]w[redacted]d [phone] presenting with contractions and pelvic and back pain. Patient reports contractions started around 0830 this morning. She reports painters came to her house and she was more active than normal getting the house ready. She said she had at least 6 contractions in about an hour, 5-7 minutes apart. She said contractions are not abnormal for her (started around 23 weeks) and stated she has a plan with Dr. Ernestina Penna for her bouts of contractions. Due to continued contractions, patient reports calling Dr. Elpidio Eric office where she was referred to the MAU. Patient describes her pelvic and back pain as 5/10. Patient endorses FM and some clear discharge. Patient denies VB, LOF, vaginal pruritus or vaginal odor. Patient denies recent intercourse or vaginal exam.   Patient reports seeing Dr. Ernestina Penna about two weeks prior where her cervix was a "little open" but the "top was closed." She stated she was not started on procardia given low BP and negative fFN at that visit.  Of note, patient said she experienced preterm labor at 33 weeks with her prior pregnancy 8 years ago where she was given procardia and put on pelvic rest. She said she delivered vaginally at 39 weeks.   OB History     Gravida  4   Para  1   Term  1   Preterm      AB  2   Living  1      SAB  1   IAB  1   Ectopic      Multiple  0   Live Births  1          Past Medical History:  Diagnosis Date   Anemia    Asthma    Breast cyst    Past Surgical History:  Procedure Laterality Date   BREAST BIOPSY Left    NO PAST SURGERIES     Family History  Problem Relation Age of Onset   Multiple sclerosis Mother    Social History   Tobacco Use   Smoking status: Never   Smokeless tobacco: Never  Substance Use Topics   Alcohol use: Not Currently   Drug use: No   Allergies:  Allergies  Allergen  Reactions   Iodine Hives   Shellfish Allergy Hives   No medications prior to admission.   Review of Systems  Respiratory:  Negative for shortness of breath.        Other than patient-reported normal pregnancy symptoms   Cardiovascular:  Negative for chest pain.  Gastrointestinal:  Positive for constipation and nausea. Negative for blood in stool and vomiting.  Genitourinary:  Positive for pelvic pain. Negative for hematuria and vaginal bleeding.  Musculoskeletal:  Positive for back pain.  Neurological:  Positive for headaches.   Physical Exam Blood pressure (!) 101/58, pulse 67, temperature 98 F (36.7 C), resp. rate 18, height 5' (1.524 m), weight 71.2 kg, SpO2 100 %, unknown if currently breastfeeding. Physical Exam Constitutional:      Appearance: Normal appearance.  HENT:     Head: Normocephalic and atraumatic.     Nose: Nose normal.  Eyes:     Extraocular Movements: Extraocular movements intact.  Cardiovascular:     Rate and Rhythm: Normal rate and regular rhythm.  Pulmonary:     Effort: Pulmonary effort is normal.     Breath sounds: Normal breath sounds.  Abdominal:  General: Bowel sounds are normal.     Palpations: Abdomen is soft.  Genitourinary:    General: Normal vulva.  Musculoskeletal:        General: Normal range of motion.  Skin:    General: Skin is warm and dry.  Neurological:     General: No focal deficit present.     Mental Status: She is alert and oriented to person, place, and time.  Psychiatric:        Mood and Affect: Mood normal.        Behavior: Behavior normal.   MAU Course Procedures  MDM - FHR: 125 bpm  Moderate variability  + Accels  None decels   - Cervical Exam performed by CNM: Dilation: 0 Effacement: 50% Vaginal Bleeding: None  - Contractions: Irritability  - UA: Ketones: 15 - Fetal fibronectin (fFN): Negative - IV LR   Patient reports limiting physical activity with this pregnancy given prior OB hx and contractions  starting around 23 weeks. Patient stated she was more active this morning. Suspect increased activity increased contraction irritability with cervical os still closed and negative fFN. Patient's BP trends lower according to [facility] records, so procardia was not started. Patient advised to rest this evening and through the weekend and drink plenty of fluids.   A&P [redacted] weeks gestation of pregnancy  - Discharge home in stable condition - Return precautions - Preterm labor precautions - Patient advised to return to MAU for new/worsening symptoms and to call OB office tomorrow to see if they would like to see her earlier than her next appointment    CNM attestation:  I have seen and examined this patient and agree with above documentation in the medical student's note.   Alison Moss is a 32 y.o. [id number] at [redacted]w[redacted]d reporting contractions that started at 0830 this morning and are currently every 5-7 mins apart. She reports she has had braxton hicks contractions since approximately 23 weeks which usually resolve with rest/hydration, however they did not today which concerned her. She reports that painter's have been working at her house and she did more activity, moving boxes, etc today. She denies leaking fluid, vaginal bleeding, urinary s/s, vaginal itching or odor. She reports normal fetal movement.  Patient receives [facility]. She reports a history of preterm labor with her previous pregnancy which resulted in her receiving BMZ and being on Procardia, but subsequently resulted in a term delivery.   PE: Patient Vitals for the past 24 hrs:  BP Temp Pulse Resp SpO2 Height Weight  05/09/22 1810 (!) 101/58 -- 67 -- -- -- --  05/09/22 1806 -- -- -- -- 100 % -- --  05/09/22 1801 -- -- -- -- 100 % -- --  05/09/22 1756 -- -- -- -- 99 % -- --  05/09/22 1751 -- -- -- -- 100 % -- --  05/09/22 1746 -- -- -- -- 100 % -- --  05/09/22 1741 -- -- -- -- 100 % -- --  05/09/22 1736 -- -- -- -- 100 % --  --  05/09/22 1726 -- -- -- -- 99 % -- --  05/09/22 1721 -- -- -- -- 99 % -- --  05/09/22 1716 -- -- -- -- 99 % -- --  05/09/22 1711 -- -- -- -- 99 % -- --  05/09/22 1706 -- -- -- -- 99 % -- --  05/09/22 1701 -- -- -- -- 98 % -- --  05/09/22 1656 -- -- -- -- 98 % -- --  05/09/22 1626 -- -- -- -- 99 % -- --  05/09/22 1621 -- -- -- -- 97 % -- --  05/09/22 1616 -- -- -- -- 98 % -- --  05/09/22 1611 -- -- -- -- 99 % -- --  05/09/22 1606 -- -- -- -- 99 % -- --  05/09/22 1601 -- -- -- -- 98 % -- --  05/09/22 1556 -- -- -- -- 98 % -- --  05/09/22 1546 -- -- -- -- 98 % -- --  05/09/22 1541 -- -- -- -- 98 % -- --  05/09/22 1536 -- -- -- -- 98 % -- --  05/09/22 1531 -- -- -- -- 98 % -- --  05/09/22 1526 -- -- -- -- 98 % -- --  05/09/22 1522 104/60 -- 83 -- -- -- --  05/09/22 1521 -- -- -- -- 98 % -- --  05/09/22 1505 (!) 98/56 98 F (36.7 C) 80 18 -- 5' (1.524 m) 71.2 kg   Gen: calm, comfortable, no acute distress Resp: normal effort, no distress Heart: regular rate Abd: soft, non-tender, gravid Dilation: Closed Effacement (%): 50 Exam by:: Sharlize Hoar,CNM  FHR: Baseline 125 bpm, moderate variability, +15x15 accels, no decels Toco: ui  ROS, labs, PMH reviewed  Results for orders placed or performed during the hospital encounter of 05/09/22 (from the past 24 hour(s))  Urinalysis, Routine w reflex microscopic -Urine, Clean Catch     Status: Abnormal   Collection Time: 05/09/22  3:17 PM  Result Value Ref Range   Color, Urine YELLOW YELLOW   APPearance CLEAR CLEAR   Specific Gravity, Urine 1.010 1.005 - 1.030   pH 7.5 5.0 - 8.0   Glucose, UA NEGATIVE NEGATIVE mg/dL   Hgb urine dipstick NEGATIVE NEGATIVE   Bilirubin Urine NEGATIVE NEGATIVE   Ketones, ur 15 (A) NEGATIVE mg/dL   Protein, ur NEGATIVE NEGATIVE mg/dL   Nitrite NEGATIVE NEGATIVE   Leukocytes,Ua NEGATIVE NEGATIVE  Fetal fibronectin     Status: None   Collection Time: 05/09/22  4:06 PM  Result Value Ref Range   Fetal  Fibronectin NEGATIVE NEGATIVE    Orders Placed This Encounter  Procedures   Urinalysis, Routine w reflex microscopic -Urine, Clean Catch   Fetal fibronectin   Discharge patient   Meds ordered this encounter  Medications   lactated ringers bolus 1,000 mL    MDM NST FFN LR bolus  Uterine irritability noted on toco. Procardia considered however BP's not within parameter range. LR bolus given. Discussed Terbutaline however patient declines. Cervix closed.  FFN negative, cervix remains closed after 2 hours. Toco with occasional ui after IVF's. Patient reports feeling some contractions but have spaced out. Reassurance provided. Reviewed adequate hydration and rest. Low suspicion for preterm labor at this time.   Assessment: 1. [redacted] weeks gestation of pregnancy   2. Preterm uterine contractions     Plan: - Discharge home in stable condition - Return precautions - Preterm labor precautions and fetal kick counts - Return to MAU as needed for new/worsening symptoms. Keep OB appointment as scheduled.    Brand Males, CNM 05/09/2022 7:49 PM

## 2022-05-09 NOTE — MAU Note (Signed)
.  Alison Moss is a 32 y.o. at Unknown here in MAU reporting: has been feeling ctx on and off since 8:30 am. More than 6 in I hour. Good fetal movement denies any vag bleeding or leaking at this time. LMP:  Onset of complaint: 8:30am Pain score: 5-6 Vitals:   05/09/22 1505  BP: (!) 98/56  Pulse: 80  Resp: 18  Temp: 98 F (36.7 C)     FHT:152 Lab orders placed from triage:  u/a

## 2022-05-15 DIAGNOSIS — Z3689 Encounter for other specified antenatal screening: Secondary | ICD-10-CM | POA: Diagnosis not present

## 2022-06-05 DIAGNOSIS — N898 Other specified noninflammatory disorders of vagina: Secondary | ICD-10-CM | POA: Diagnosis not present

## 2022-06-05 DIAGNOSIS — Z3A30 30 weeks gestation of pregnancy: Secondary | ICD-10-CM | POA: Diagnosis not present

## 2022-06-05 DIAGNOSIS — Z3689 Encounter for other specified antenatal screening: Secondary | ICD-10-CM | POA: Diagnosis not present

## 2022-06-05 DIAGNOSIS — O26893 Other specified pregnancy related conditions, third trimester: Secondary | ICD-10-CM | POA: Diagnosis not present

## 2022-06-05 DIAGNOSIS — B3731 Acute candidiasis of vulva and vagina: Secondary | ICD-10-CM | POA: Diagnosis not present

## 2022-06-20 DIAGNOSIS — Z3A32 32 weeks gestation of pregnancy: Secondary | ICD-10-CM | POA: Diagnosis not present

## 2022-06-20 DIAGNOSIS — O26893 Other specified pregnancy related conditions, third trimester: Secondary | ICD-10-CM | POA: Diagnosis not present

## 2022-06-20 DIAGNOSIS — B3731 Acute candidiasis of vulva and vagina: Secondary | ICD-10-CM | POA: Diagnosis not present

## 2022-07-05 DIAGNOSIS — O26893 Other specified pregnancy related conditions, third trimester: Secondary | ICD-10-CM | POA: Diagnosis not present

## 2022-07-05 DIAGNOSIS — Z3A34 34 weeks gestation of pregnancy: Secondary | ICD-10-CM | POA: Diagnosis not present

## 2022-07-05 DIAGNOSIS — B3731 Acute candidiasis of vulva and vagina: Secondary | ICD-10-CM | POA: Diagnosis not present

## 2022-07-23 DIAGNOSIS — O26843 Uterine size-date discrepancy, third trimester: Secondary | ICD-10-CM | POA: Diagnosis not present

## 2022-07-23 DIAGNOSIS — Z3685 Encounter for antenatal screening for Streptococcus B: Secondary | ICD-10-CM | POA: Diagnosis not present

## 2022-07-23 DIAGNOSIS — Z3A36 36 weeks gestation of pregnancy: Secondary | ICD-10-CM | POA: Diagnosis not present

## 2022-07-31 DIAGNOSIS — Z3A38 38 weeks gestation of pregnancy: Secondary | ICD-10-CM | POA: Diagnosis not present

## 2022-07-31 DIAGNOSIS — O26843 Uterine size-date discrepancy, third trimester: Secondary | ICD-10-CM | POA: Diagnosis not present

## 2022-08-07 DIAGNOSIS — Z3A39 39 weeks gestation of pregnancy: Secondary | ICD-10-CM | POA: Diagnosis not present

## 2022-08-07 DIAGNOSIS — O403XX0 Polyhydramnios, third trimester, not applicable or unspecified: Secondary | ICD-10-CM | POA: Diagnosis not present

## 2022-08-08 ENCOUNTER — Other Ambulatory Visit: Payer: Self-pay

## 2022-08-08 ENCOUNTER — Encounter (HOSPITAL_COMMUNITY): Payer: Self-pay | Admitting: Obstetrics and Gynecology

## 2022-08-08 ENCOUNTER — Inpatient Hospital Stay (HOSPITAL_COMMUNITY): Payer: BC Managed Care – PPO | Admitting: Anesthesiology

## 2022-08-08 ENCOUNTER — Inpatient Hospital Stay (HOSPITAL_COMMUNITY)
Admission: AD | Admit: 2022-08-08 | Discharge: 2022-08-10 | Disposition: A | Discharge status: Home / Self Care | Payer: BC Managed Care – PPO | Source: Home / Self Care | Attending: Obstetrics and Gynecology | Admitting: Obstetrics and Gynecology

## 2022-08-08 DIAGNOSIS — Z23 Encounter for immunization: Secondary | ICD-10-CM | POA: Diagnosis not present

## 2022-08-08 DIAGNOSIS — O26893 Other specified pregnancy related conditions, third trimester: Secondary | ICD-10-CM | POA: Diagnosis not present

## 2022-08-08 DIAGNOSIS — O9902 Anemia complicating childbirth: Secondary | ICD-10-CM | POA: Diagnosis not present

## 2022-08-08 DIAGNOSIS — Z6791 Unspecified blood type, Rh negative: Secondary | ICD-10-CM | POA: Diagnosis not present

## 2022-08-08 DIAGNOSIS — Z3A39 39 weeks gestation of pregnancy: Secondary | ICD-10-CM | POA: Diagnosis not present

## 2022-08-08 LAB — CBC
HCT: 32.4 % — ABNORMAL LOW (ref 36.0–46.0)
Hemoglobin: 10.7 g/dL — ABNORMAL LOW (ref 12.0–15.0)
MCH: 31.4 pg (ref 26.0–34.0)
MCHC: 33 g/dL (ref 30.0–36.0)
MCV: 95 fL (ref 80.0–100.0)
Platelets: 149 10*3/uL — ABNORMAL LOW (ref 150–400)
RBC: 3.41 MIL/uL — ABNORMAL LOW (ref 3.87–5.11)
RDW: 13.4 % (ref 11.5–15.5)
WBC: 9.7 10*3/uL (ref 4.0–10.5)
nRBC: 0 % (ref 0.0–0.2)

## 2022-08-08 LAB — TYPE AND SCREEN
ABO/RH(D): O NEG
Antibody Screen: NEGATIVE

## 2022-08-08 MED ORDER — OXYTOCIN-SODIUM CHLORIDE 30-0.9 UT/500ML-% IV SOLN
2.5000 [IU]/h | INTRAVENOUS | Status: DC
  Administered 2022-08-09: 2.5 [IU]/h via INTRAVENOUS

## 2022-08-08 MED ORDER — PHENYLEPHRINE 80 MCG/ML (10ML) SYRINGE FOR IV PUSH (FOR BLOOD PRESSURE SUPPORT)
80.0000 ug | PREFILLED_SYRINGE | INTRAVENOUS | Status: DC | PRN
  Administered 2022-08-09: 80 ug via INTRAVENOUS

## 2022-08-08 MED ORDER — ACETAMINOPHEN 325 MG PO TABS: 650.0000 mg | ORAL_TABLET | ORAL | Status: DC | PRN

## 2022-08-08 MED ORDER — EPHEDRINE 5 MG/ML INJ
10.0000 mg | INTRAVENOUS | Status: AC | PRN
  Administered 2022-08-09 (×2): 10 mg via INTRAVENOUS
  Filled 2022-08-08: qty 5

## 2022-08-08 MED ORDER — LACTATED RINGERS IV SOLN: INTRAVENOUS | Status: DC

## 2022-08-08 MED ORDER — ONDANSETRON HCL 4 MG/2ML IJ SOLN: 4.0000 mg | Freq: Four times a day (QID) | INTRAMUSCULAR | Status: DC | PRN

## 2022-08-08 MED ORDER — DIPHENHYDRAMINE HCL 50 MG/ML IJ SOLN: 12.5000 mg | INTRAMUSCULAR | Status: DC | PRN

## 2022-08-08 MED ORDER — HYDROXYZINE HCL 50 MG PO TABS: 50.0000 mg | ORAL_TABLET | Freq: Four times a day (QID) | ORAL | Status: DC | PRN

## 2022-08-08 MED ORDER — SODIUM CHLORIDE 0.9% FLUSH: 3.0000 mL | INTRAVENOUS | Status: DC | PRN

## 2022-08-08 MED ORDER — LIDOCAINE HCL (PF) 1 % IJ SOLN: 30.0000 mL | INTRAMUSCULAR | Status: DC | PRN

## 2022-08-08 MED ORDER — SODIUM CHLORIDE 0.9% FLUSH: 3.0000 mL | Freq: Two times a day (BID) | INTRAVENOUS | Status: DC

## 2022-08-08 MED ORDER — TERBUTALINE SULFATE 1 MG/ML IJ SOLN: 0.2500 mg | Freq: Once | INTRAMUSCULAR | Status: DC | PRN

## 2022-08-08 MED ORDER — SOD CITRATE-CITRIC ACID 500-334 MG/5ML PO SOLN: 30.0000 mL | ORAL | Status: DC | PRN

## 2022-08-08 MED ORDER — SODIUM CHLORIDE 0.9 % IV SOLN
25.0000 mg | Freq: Once | INTRAVENOUS | Status: AC
  Administered 2022-08-08: 25 mg via INTRAVENOUS
  Filled 2022-08-08: qty 1

## 2022-08-08 MED ORDER — EPHEDRINE 5 MG/ML INJ: 10.0000 mg | INTRAVENOUS | Status: DC | PRN

## 2022-08-08 MED ORDER — LACTATED RINGERS IV SOLN: 500.0000 mL | Freq: Once | INTRAVENOUS | Status: DC

## 2022-08-08 MED ORDER — PHENYLEPHRINE 80 MCG/ML (10ML) SYRINGE FOR IV PUSH (FOR BLOOD PRESSURE SUPPORT)
80.0000 ug | PREFILLED_SYRINGE | INTRAVENOUS | Status: DC | PRN
  Administered 2022-08-08 (×2): 80 ug via INTRAVENOUS
  Filled 2022-08-08: qty 10

## 2022-08-08 MED ORDER — OXYTOCIN-SODIUM CHLORIDE 30-0.9 UT/500ML-% IV SOLN
1.0000 m[IU]/min | INTRAVENOUS | Status: DC
  Administered 2022-08-08: 2 m[IU]/min via INTRAVENOUS
  Filled 2022-08-08: qty 500

## 2022-08-08 MED ORDER — FENTANYL CITRATE (PF) 100 MCG/2ML IJ SOLN
100.0000 ug | Freq: Once | INTRAMUSCULAR | Status: AC
  Administered 2022-08-08: 100 ug via INTRAVENOUS
  Filled 2022-08-08: qty 2

## 2022-08-08 MED ORDER — LACTATED RINGERS IV BOLUS
500.0000 mL | Freq: Once | INTRAVENOUS | Status: AC
  Administered 2022-08-08: 500 mL via INTRAVENOUS

## 2022-08-08 MED ORDER — OXYTOCIN 10 UNIT/ML IJ SOLN: 10.0000 [IU] | Freq: Once | INTRAMUSCULAR | Status: DC

## 2022-08-08 MED ORDER — LACTATED RINGERS IV SOLN
500.0000 mL | INTRAVENOUS | Status: DC | PRN
  Administered 2022-08-08: 1000 mL via INTRAVENOUS

## 2022-08-08 MED ORDER — OXYTOCIN BOLUS FROM INFUSION
333.0000 mL | Freq: Once | INTRAVENOUS | Status: AC
  Administered 2022-08-09: 333 mL via INTRAVENOUS

## 2022-08-08 MED ORDER — FENTANYL-BUPIVACAINE-NACL 0.5-0.125-0.9 MG/250ML-% EP SOLN
12.0000 mL/h | EPIDURAL | Status: DC | PRN
  Administered 2022-08-08: 12 mL/h via EPIDURAL
  Filled 2022-08-08: qty 250

## 2022-08-08 MED ORDER — LIDOCAINE-EPINEPHRINE (PF) 1.5 %-1:200000 IJ SOLN
INTRAMUSCULAR | Status: DC | PRN
  Administered 2022-08-08: 3 mL via EPIDURAL

## 2022-08-08 MED ORDER — SODIUM CHLORIDE 0.9 % IV SOLN: 250.0000 mL | INTRAVENOUS | Status: DC | PRN

## 2022-08-08 NOTE — Plan of Care (Signed)

## 2022-08-08 NOTE — Anesthesia Procedure Notes (Signed)
Epidural Patient location during procedure: OB Start time: 08/08/2022 11:17 PM End time: 08/08/2022 11:32 PM  Staffing Anesthesiologist: Lewie Loron, MD Performed: anesthesiologist   Preanesthetic Checklist Completed: patient identified, IV checked, risks and benefits discussed, monitors and equipment checked, pre-op evaluation and timeout performed  Epidural Patient position: sitting Prep: DuraPrep and site prepped and draped Patient monitoring: heart rate, continuous pulse ox and blood pressure Approach: midline Location: L2-L3 Injection technique: LOR air and LOR saline  Needle:  Needle type: Tuohy  Needle gauge: 17 G Needle length: 9 cm Needle insertion depth: 6 cm Catheter type: closed end flexible Catheter size: 19 Gauge Catheter at skin depth: 12 cm Test dose: negative and 1.5% lidocaine with Epi 1:200 K  Assessment Sensory level: T8 Events: blood not aspirated, no cerebrospinal fluid, injection not painful, no injection resistance, no paresthesia and negative IV test  Additional Notes Reason for block:procedure for pain

## 2022-08-08 NOTE — H&P (Signed)
OB ADMISSION/ HISTORY & PHYSICAL:  Admission Date: 08/08/2022 11:25 AM  Admit Diagnosis: CTX    Alison Moss is a 32 y.o. female presenting for painful contractions. Prodromal labor for past week, OF placed inoffice yesterday and expelled at 2030 last night, was able to sleep last night. This morning contractions increased to every 2-3 minutes and noted bloody show. Cvx 4-5/60/-1 in MAU on initial assessment, received therapeutic rest for couple fours with Fentanyl and Phenergan IV, cvx 6/70/-2 on reexam with BBOW.  Desires unmedicated waterbirth, class completed. Spouse and doula Seward Grater for labor support.  Expecting BG Josie.   Prenatal History: [id number]   EDC : 08/14/2022, Date entered prior to episode creation  Prenatal care at [facility] since 10 wks   Prenatal course complicated by: Rh neg, spouse Rh neg, declined Rhogam Hx PTL at 33 wks, delivered 39 wks Mild anemia on oral Fe Anxiety, off Celexa in pregnancy and stable  Prenatal Labs: ABO, Rh:   O neg Antibody:  neg Rubella:   imm RPR:   neg HBsAg:   neg HIV:   neg GBS:   neg 1 hr Glucola : 132 Genetic Screening: Panorama LR XX, AFP1 neg Ultrasound: normal XX anatomy, AGA, ant plac, AFI wnl Growth sono for S>D at 36.6 wks - EFW 67%, AC 91%, AFI 21 Repeat at 38 wks AFI 22, 39 wks - AFI 14   Medical / Surgical History :  Past medical history:  Past Medical History:  Diagnosis Date   Anemia    Asthma    Breast cyst      Past surgical history:  Past Surgical History:  Procedure Laterality Date   BREAST BIOPSY Left    DILATION AND CURETTAGE OF UTERUS  06/14/2021   NO PAST SURGERIES     WISDOM TOOTH EXTRACTION  2016     Family History:  Family History  Problem Relation Age of Onset   Multiple sclerosis Mother      Social History:  reports that she has never smoked. She has never used smokeless tobacco. She reports that she does not currently use alcohol. She reports that she  does not use drugs.  Allergies: Iodine and Shellfish allergy   Current Medications at time of admission:  PNV   Review of Systems: ROS As noted in HPI Physical Exam: Vital signs and nursing notes reviewed.  Patient Vitals for the past 24 hrs:  BP Temp Temp src Pulse Resp SpO2 Height Weight  08/08/22 1220 -- -- -- -- -- -- 5' (1.524 m) 77.6 kg  08/08/22 1145 (!) 92/50 98.6 F (37 C) Oral 100 17 98 % -- --     General: AAO x 3, NAD, coping well Heart: RRR Lungs:CTAB Abdomen: Gravid, NT, Leopold's vertex, fetal spine to maternal R Extremities: no edema Genitalia / VE: Dilation: 6 Effacement (%): 70 Station: -2 Exam by:: Arlan Organ, CNM   FHR: 140 BPM, mod variability, + accels, no decels TOCO: Ctx 4-5 min  Labs:   Pending T&S, CBC, RPR  No results for input(s): "WBC", "HGB", "HCT", "PLT" in the last 72 hours.   Assessment/Plan:  32 y.o. G4P1021 at [redacted]w[redacted]d  Fetal wellbeing - FHT category 1 EFW 7.5 lbs, AGA  Labor: Protracted early stage, s/p OF, therapeutic rest with fentanyl/phenergan Now with cervical change, will admit to L&D Routine orders  GBS neg Rubella imm Rh neg, no Rhogam indicated, spouse Rh neg  Pain control: desires hydrotherapy/WB, class  completed Analgesia/anesthesia PRN  Anticipated MOD: NSVB Pelvis proven 7'15"  Plans to breastfeed. POC discussed with patient and support team, all questions answered.  Dr Conni Elliot notified of admission / plan of care   Neta Mends CNM, MSN 08/08/2022, 3:04 PM

## 2022-08-08 NOTE — Anesthesia Preprocedure Evaluation (Signed)
Anesthesia Evaluation  Patient identified by MRN, date of birth, ID band Patient awake    Reviewed: Allergy & Precautions, Patient's Chart, lab work & pertinent test results  Airway Mallampati: II  TM Distance: >3 FB Neck ROM: Full    Dental no notable dental hx.    Pulmonary neg pulmonary ROS   Pulmonary exam normal breath sounds clear to auscultation       Cardiovascular negative cardio ROS Normal cardiovascular exam Rhythm:Regular Rate:Normal     Neuro/Psych negative neurological ROS     GI/Hepatic negative GI ROS, Neg liver ROS,,,  Endo/Other  negative endocrine ROS    Renal/GU negative Renal ROS     Musculoskeletal negative musculoskeletal ROS (+)    Abdominal  (+) + obese  Peds  Hematology  (+) Blood dyscrasia, anemia   Anesthesia Other Findings   Reproductive/Obstetrics                             Anesthesia Physical Anesthesia Plan  ASA: 2  Anesthesia Plan: Epidural   Post-op Pain Management:    Induction:   PONV Risk Score and Plan:   Airway Management Planned:   Additional Equipment:   Intra-op Plan:   Post-operative Plan:   Informed Consent: I have reviewed the patients History and Physical, chart, labs and discussed the procedure including the risks, benefits and alternatives for the proposed anesthesia with the patient or authorized representative who has indicated his/her understanding and acceptance.       Plan Discussed with:   Anesthesia Plan Comments:        Anesthesia Quick Evaluation

## 2022-08-08 NOTE — MAU Note (Signed)
Pt had foley bulb placed around 4 pm at the doctor's office yesterday. Pt states the foley bulb fell out at 20:30 last night. Pt states she has had ctx every 2-3 minutes and has had some bloody show.

## 2022-08-08 NOTE — Progress Notes (Signed)
       S:  Hydrotherapy x 1 hour, contractions slowed down while in tub then returned more frequently with ambulation. Feels more intense. Working on positioning with doula and spouse for support.  O: Vitals:   08/08/22 1540 08/08/22 1722 08/08/22 1848 08/08/22 1922  BP:  (!) 105/59 (!) 89/52 (!) 76/40  Pulse:  99 (!) 106 93  Resp:  17    Temp: 98.4 F (36.9 C)  98 F (36.7 C)   TempSrc: Oral  Oral   SpO2:      Weight:      Height:         FHT:  FHR: 140 bpm, variability: moderate,  accelerations:  Present,  decelerations:  Absent UC:   irregular, every 3-6 minutes SVE:   Dilation: 6 Effacement (%): 70 Station: -2 Exam by:: Arlan Organ  ROP and deflexed, cervix posterior Clear fluid  A / P: Protracted active phase Recc Pitocin augmentation and agrees, will start low dose Pitocin.  Continue with position changes Fetal Wellbeing:  Category I Pain Control:  Labor support without medications and Water tub  Anticipated MOD:  NSVD  Neta Mends, CNM, MSN 08/08/2022, 7:23 PM

## 2022-08-08 NOTE — Progress Notes (Signed)
Alison Moss is a 32 y.o. [id number] at [redacted]w[redacted]d admitted for Normal labor [O80, Z37.9]  Subjective: Feels contractions but coping well. Doula and spouse at [facility] for support. Discussed AROM and agrees.   Objective: Vitals:   08/08/22 1145 08/08/22 1220 08/08/22 1539 08/08/22 1540  BP: (!) 92/50  (!) 115/59   Pulse: 100  (!) 122   Resp: 17     Temp: 98.6 F (37 C)   98.4 F (36.9 C)  TempSrc: Oral   Oral  SpO2: 98%     Weight:  77.6 kg    Height:  5' (1.524 m)      FHT:  FHR: 140 bpm, variability: moderate,  accelerations:  Present,  decelerations:  Absent UC:   irregular, every 2-4 minutes SVE:   Dilation: 6 Effacement (%): 70 Station: -2 Exam by:: Arlan Organ, CNM AROM clear fluid, ROP Labs:  No results for input(s): "WBC", "HGB", "HCT", "PLT" in the last  72 hours.  Assessment / Plan: G3P1011 32 y.o. [redacted]w[redacted]d Spontaneous labor, progressing normally  Labor:  AROM for augmentation Preeclampsia:  no signs or symptoms of toxicity Fetal Wellbeing:  Category I Pain Control:  Labor support without medications, hydrotherapy I/D:   GBS neg  Anticipated MOD:  NSVD  Neta Mends, CNM, MSN 08/08/2022, 4:34 PM

## 2022-08-09 LAB — CBC
HCT: 30.7 % — ABNORMAL LOW (ref 36.0–46.0)
Hemoglobin: 10.2 g/dL — ABNORMAL LOW (ref 12.0–15.0)
MCH: 31.3 pg (ref 26.0–34.0)
MCHC: 33.2 g/dL (ref 30.0–36.0)
MCV: 94.2 fL (ref 80.0–100.0)
Platelets: 145 10*3/uL — ABNORMAL LOW (ref 150–400)
RBC: 3.26 MIL/uL — ABNORMAL LOW (ref 3.87–5.11)
RDW: 13.3 % (ref 11.5–15.5)
WBC: 17.8 10*3/uL — ABNORMAL HIGH (ref 4.0–10.5)
nRBC: 0 % (ref 0.0–0.2)

## 2022-08-09 LAB — RPR: RPR Ser Ql: NONREACTIVE

## 2022-08-09 MED ORDER — FLEET ENEMA 7-19 GM/118ML RE ENEM: 1.0000 | ENEMA | Freq: Every day | RECTAL | Status: DC | PRN

## 2022-08-09 MED ORDER — ONDANSETRON HCL 4 MG/2ML IJ SOLN: 4.0000 mg | INTRAMUSCULAR | Status: DC | PRN

## 2022-08-09 MED ORDER — WITCH HAZEL-GLYCERIN EX PADS: 1.0000 | MEDICATED_PAD | CUTANEOUS | Status: DC | PRN

## 2022-08-09 MED ORDER — BISACODYL 10 MG RE SUPP: 10.0000 mg | Freq: Every day | RECTAL | Status: DC | PRN

## 2022-08-09 MED ORDER — COCONUT OIL OIL
1.0000 | TOPICAL_OIL | Status: DC | PRN
  Administered 2022-08-09: 1 via TOPICAL

## 2022-08-09 MED ORDER — TETANUS-DIPHTH-ACELL PERTUSSIS 5-2.5-18.5 LF-MCG/0.5 IM SUSY: 0.5000 mL | PREFILLED_SYRINGE | Freq: Once | INTRAMUSCULAR | Status: DC

## 2022-08-09 MED ORDER — IBUPROFEN 600 MG PO TABS
600.0000 mg | ORAL_TABLET | Freq: Four times a day (QID) | ORAL | Status: DC
  Administered 2022-08-09 – 2022-08-10 (×5): 600 mg via ORAL
  Filled 2022-08-09 (×5): qty 1

## 2022-08-09 MED ORDER — SENNOSIDES-DOCUSATE SODIUM 8.6-50 MG PO TABS
2.0000 | ORAL_TABLET | ORAL | Status: DC
  Administered 2022-08-09 – 2022-08-10 (×2): 2 via ORAL
  Filled 2022-08-09 (×2): qty 2

## 2022-08-09 MED ORDER — ZOLPIDEM TARTRATE 5 MG PO TABS: 5.0000 mg | ORAL_TABLET | Freq: Every evening | ORAL | Status: DC | PRN

## 2022-08-09 MED ORDER — DIBUCAINE (PERIANAL) 1 % EX OINT: 1.0000 | TOPICAL_OINTMENT | CUTANEOUS | Status: DC | PRN

## 2022-08-09 MED ORDER — ACETAMINOPHEN 500 MG PO TABS
1000.0000 mg | ORAL_TABLET | Freq: Four times a day (QID) | ORAL | Status: DC
  Administered 2022-08-09 – 2022-08-10 (×4): 1000 mg via ORAL
  Filled 2022-08-09 (×4): qty 2

## 2022-08-09 MED ORDER — ONDANSETRON HCL 4 MG PO TABS: 4.0000 mg | ORAL_TABLET | ORAL | Status: DC | PRN

## 2022-08-09 MED ORDER — PRENATAL MULTIVITAMIN CH
1.0000 | ORAL_TABLET | Freq: Every day | ORAL | Status: DC
  Filled 2022-08-09: qty 1

## 2022-08-09 MED ORDER — DIPHENHYDRAMINE HCL 25 MG PO CAPS: 25.0000 mg | ORAL_CAPSULE | Freq: Four times a day (QID) | ORAL | Status: DC | PRN

## 2022-08-09 MED ORDER — SIMETHICONE 80 MG PO CHEW: 80.0000 mg | CHEWABLE_TABLET | ORAL | Status: DC | PRN

## 2022-08-09 MED ORDER — BENZOCAINE-MENTHOL 20-0.5 % EX AERO: 1.0000 | INHALATION_SPRAY | CUTANEOUS | Status: DC | PRN

## 2022-08-09 NOTE — Anesthesia Postprocedure Evaluation (Signed)
Anesthesia Post Note  Patient: Alison Moss  Procedure(s) Performed: AN AD HOC LABOR EPIDURAL     Patient location during evaluation: Mother Baby Anesthesia Type: Epidural Level of consciousness: awake and alert Pain management: pain level controlled Vital Signs Assessment: post-procedure vital signs reviewed and stable Respiratory status: spontaneous breathing, nonlabored ventilation and respiratory function stable Cardiovascular status: stable Postop Assessment: no headache, no backache, epidural receding, no apparent nausea or vomiting, patient able to bend at knees, able to ambulate and adequate PO intake Anesthetic complications: no   No notable events documented.  Last Vitals:  Vitals:   08/09/22 0519 08/09/22 0550  BP: (!) 98/46 (!) 95/55  Pulse: 89 83  Resp:  18  Temp:  36.7 C  SpO2:  100%    Last Pain:  Vitals:   08/09/22 0743  TempSrc:   PainSc: 0-No pain   Pain Goal:                Epidural/Spinal Function Cutaneous sensation: Normal sensation (08/09/22 0743)  Laban Emperor

## 2022-08-09 NOTE — Lactation Note (Signed)
This note was copied from a baby's chart. Lactation Consultation Note  Patient Name: Alison Moss [id number] Date: 08/09/2022 Age:32 hours Reason for consult: Follow-up assessment;Term;Breastfeeding assistance;Breast augmentation (Breasts were augmented with a fat deposit from the birth parent's body)  The infant was 5 hours old.  LC was called to the birth parent's room to observe the latch.  The infant was showing feeding cues but pushed the nipple away when attempting to latch.  The birth parent attempted to put the infant on both sides.  LC assisted with using a teacup hold.  The infant latched deeply and would take 2-3 sucks before coming off the breast.  The birth parent latched the infant and LC noticed dimpling in the infant's cheeks.  LC encouraged the birth parent to use the teacup hold and hold the nipple in place until the infant is able to get a good latch.  The infant was off and on the breast for 10 min when LC was in the room and was still attempting to latch when LC left.  LC asked the birth parent to hand express after the feeding and feed the infant some expressed milk.  LC offered breast shells to the birth parent to help with everting the nipple.  The birth parent stated that she would call later to have someone give her shells.  The birth parent had no further concerns.    Maternal Data Has patient been taught Hand Expression?: Yes Does the patient have breastfeeding experience prior to this delivery?: Yes How long did the patient breastfeed?: 2 years with now 32 year old  Feeding Mother's Current Feeding Choice: Breast Milk  LATCH Score Latch: Repeated attempts needed to sustain latch, nipple held in mouth throughout feeding, stimulation needed to elicit sucking reflex.  Audible Swallowing: A few with stimulation  Type of Nipple: Flat  Comfort (Breast/Nipple): Soft / non-tender  Hold (Positioning): Assistance needed to correctly position infant at  breast and maintain latch.  LATCH Score: 6   Lactation Tools Discussed/Used    Interventions Interventions: Assisted with latch;Adjust position;Education  Discharge Pump: DEBP;Personal  Consult Status Consult Status: Follow-up Date: 08/10/22 Follow-up type: In-patient   Delene Loll 08/09/2022, 9:39 AM

## 2022-08-09 NOTE — Lactation Note (Signed)
This note was copied from a baby's chart. Lactation Consultation Note  Patient Name: Alison Moss [id number] Date: 08/09/2022 Age:32 hours Reason for consult: Breastfeeding assistance;Mother's request. Birth Parent requested latch assistance, LC assisted with reverse pressure softening to help evert nipple shaft out more prior to latching infant at the breast. Birth Parent latched infant on her right breast using the cross cradle hold, infant was on and off the breast and still BF after 12 minutes when LC left the room. Birth Parent knows if infant doesn't latch she can call RN/LC for latch further latch  assistance  if needed. Birth Parent knows she can hand express and give infant back colostrum by spoon as she continue to work towards infant learning how to breastfeed. Birth Parent  brought frozen colostrum in syringes from home that she plans to offer to infant after latching infant at the breast, she is aware they must be used within 24 hours. Birth Parent informed LC she has Doula and LC that will follow up with her once she and infant are discharged from the hospital.   Maternal Data  Birth Parent has past BF experience she BF her 105 year old son for 2 years.   Feeding Mother's Current Feeding Choice: Breast Milk  LATCH Score Latch: Repeated attempts needed to sustain latch, nipple held in mouth throughout feeding, stimulation needed to elicit sucking reflex.  Audible Swallowing: A few with stimulation  Type of Nipple: Everted at rest and after stimulation (short shafted but nipple responds well to reverse pressure softening.)  Comfort (Breast/Nipple): Soft / non-tender  Hold (Positioning): Assistance needed to correctly position infant at breast and maintain latch.  LATCH Score: 7   Lactation Tools Discussed/Used    Interventions Interventions: Expressed milk;LC Services brochure;Position options;Support pillows;Adjust position;Assisted with latch;Skin to  skin  Discharge    Consult Status Consult Status: Follow-up Date: 08/10/22 Follow-up type: In-patient    Frederico Hamman 08/09/2022, 6:31 PM

## 2022-08-09 NOTE — Lactation Note (Signed)
This note was copied from a baby's chart. Lactation Consultation Note  Patient Name: Alison Moss [id number] Date: 08/09/2022 Age:32 hours Reason for consult: Initial assessment;Term;Breastfeeding assistance;Other (Comment);Breast augmentation (Breast cyst removal; Breast fat grafting surgery (Augmentation with own body fat))  The infant was at 4 hours old.  Per the birth parent the infant did the breast crawl after birth and has been feeding well.  The birth parent stated that she has flatter nipples and has been pre pumping prior to latching the infant.  The birth parent noticed positive breast changes during her pregnancy despite having breast surgeries (see the flow sheet for more info). The birth parent is an experienced breastfeeding parent and would like to be seen PRN.  LC reviewed cluster feeding.  The birth parent had no concerns and has collected colostrum in case she needs to supplement.  The birth parent will call at the next feeding to have the latch checked.   Infant Feeding Plan:  Breastfeed 8+ times in 24 hours according to feeding cues.  Pre pump prior to latching the infant.  Call RN/LC for assistance with breastfeeding.    Maternal Data Has patient been taught Hand Expression?: Yes Does the patient have breastfeeding experience prior to this delivery?: Yes How long did the patient breastfeed?: 2 years with now 32 year old  Feeding Mother's Current Feeding Choice: Breast Milk   Interventions Interventions: Breast feeding basics reviewed;Education;LC Services brochure  Discharge Pump: DEBP;Personal  Consult Status Consult Status: PRN   Delene Loll 08/09/2022, 8:39 AM

## 2022-08-10 MED ORDER — IBUPROFEN 600 MG PO TABS: 600.0000 mg | ORAL_TABLET | Freq: Four times a day (QID) | ORAL | 0 refills | Status: DC

## 2022-08-10 MED ORDER — ACETAMINOPHEN 500 MG PO TABS: 1000.0000 mg | ORAL_TABLET | Freq: Four times a day (QID) | ORAL | 0 refills | Status: DC

## 2022-08-10 NOTE — Progress Notes (Signed)
CSW was consulted due to history of anxiety. When CSW attempted to meet with CSW to complete clinical assessment, MOB had been discharged. Per chart review, MOB has an active prescription of Lexapro through her OBGYN to start taking if MOB starts to endorse symptoms of anxiety. MOB had an  [place] Postnatal Depression Scale of 5 during hospital admission.  Signed,  Norberto Sorenson, MSW, LCSWA, LCASA 08/10/2022 2:15 PM

## 2022-08-10 NOTE — Discharge Summary (Signed)
OB Discharge Summary  Patient Name: Alison Moss DOB: 1990-10-17 MRN: [id number]  Date of admission: 08/08/2022 Delivering MD: Neta Mends  Date of discharge: 08/10/2022  Admitting diagnosis: Normal labor [O80, Z37.9] Intrauterine pregnancy: [redacted]w[redacted]d     Secondary diagnosis:Principal Problem:   Postpartum care following vaginal delivery 7/26 Active Problems:   SVD (spontaneous vaginal delivery)  Additional problems:none     Discharge diagnosis: Term Pregnancy Delivered                                                                     Post partum procedures: none  Augmentation: AROM, Pitocin, and OP Foley  Complications: None  Hospital course:  Onset of Labor With Vaginal Delivery      32 y.o. yo [id number] at [redacted]w[redacted]d was admitted in Latent Labor on 08/08/2022. Labor course was complicated by need for augmentation.   Membrane Rupture Time/Date: 4:30 PM,08/08/2022  Delivery Method:Vaginal, Spontaneous Episiotomy: None Lacerations:    Patient had a postpartum course complicated by nothing.  She is ambulating, tolerating a regular diet, passing flatus, and urinating well. Pt denies active depression/ anxiety, notes good support at home and is aware of what to look for in terms of worsening anxiety/ depression. Despite history and prior use of meds pt did well in pregnancy w/o meds and prefers expectant management. Aware to call with any issues. Patient is discharged home in stable condition on 08/10/22.  Newborn Data: Birth date:08/09/2022 Birth time:3:38 AM Gender:Female Living status:Living Apgars:8 ,9  Weight:3940 g  Physical exam  Vitals:   08/09/22 0519 08/09/22 0550 08/09/22 1128 08/09/22 2324  BP: (!) 98/46 (!) 95/55 94/60 106/69  Pulse: 89 83 77 68  Resp:  18 16 18   Temp:  98 F (36.7 C) 98.5 F (36.9 C) 97.8 F (36.6 C)  TempSrc:  Oral Oral Oral  SpO2:  100% 98% 99%  Weight:      Height:       General: alert, cooperative, and no distress Lochia:  appropriate Uterine Fundus: firm Incision: N/A DVT Evaluation: No evidence of DVT seen on physical exam. Labs: Lab Results  Component Value Date   WBC 17.8 (H) 08/09/2022   HGB 10.2 (L) 08/09/2022   HCT 30.7 (L) 08/09/2022   MCV 94.2 08/09/2022   PLT 145 (L) 08/09/2022      Latest Ref Rng & Units 10/29/2018   11:08 AM  CMP  Glucose 70 - 99 mg/dL 78   BUN 6 - 23 mg/dL 5   Creatinine 1.30 - 8.65 mg/dL 7.84   Sodium 696 - 295 mEq/L 138   Potassium 3.5 - 5.1 mEq/L 4.2   Chloride 96 - 112 mEq/L 104   CO2 19 - 32 mEq/L 27   Calcium 8.4 - 10.5 mg/dL 9.5   Total Protein 6.0 - 8.3 g/dL 7.2   Total Bilirubin 0.2 - 1.2 mg/dL 1.4   Alkaline Phos 39 - 117 U/L 102   AST 0 - 37 U/L 14   ALT 0 - 35 U/L 12     Discharge instruction: per After Visit Summary and "Baby and Me Booklet".  After Visit Meds:  Allergies as of 08/10/2022       Reactions   Iodine  Hives   Shellfish Allergy Hives        Medication List     STOP taking these medications    citalopram 40 MG tablet Commonly known as: CELEXA       TAKE these medications    acetaminophen 500 MG tablet Commonly known as: TYLENOL Take 2 tablets (1,000 mg total) by mouth every 6 (six) hours.   cetirizine 10 MG chewable tablet Commonly known as: ZYRTEC Chew 10 mg by mouth daily.   D3-50 1.25 MG (50000 UT) capsule Generic drug: Cholecalciferol TAKE 1 WEEKLY FOR 12 WEEKS   fluticasone 50 MCG/ACT nasal spray Commonly known as: FLONASE Place 2 sprays into both nostrils daily.   ibuprofen 600 MG tablet Commonly known as: ADVIL Take 1 tablet (600 mg total) by mouth every 6 (six) hours.   multivitamin-prenatal 27-0.8 MG Tabs tablet Take 1 tablet by mouth daily at 12 noon.        Diet: routine diet  Activity: Advance as tolerated. Pelvic rest for 6 weeks.   Outpatient follow up:6 weeks Follow up Appt:No future appointments. Follow up visit: No follow-ups on file.  Postpartum contraception: Not  Discussed  Newborn Data: Live born female  Birth Weight: 8 lb 11 oz (3940 g) APGAR: 8, 9  Newborn Delivery   Birth date/time: 08/09/2022 03:38:00 Delivery type: Vaginal, Spontaneous     Baby Feeding: Breast Disposition:home with mother   08/10/2022 Lendon Colonel, MD

## 2022-08-21 ENCOUNTER — Inpatient Hospital Stay (HOSPITAL_COMMUNITY): Admission: AD | Admit: 2022-08-21 | Payer: BC Managed Care – PPO | Source: Home / Self Care

## 2022-08-21 ENCOUNTER — Inpatient Hospital Stay (HOSPITAL_COMMUNITY): Payer: BC Managed Care – PPO

## 2022-09-09 ENCOUNTER — Telehealth (HOSPITAL_COMMUNITY): Payer: Self-pay | Admitting: *Deleted

## 2022-09-09 NOTE — Telephone Encounter (Signed)
09/09/2022  Name: TOMMI GILROY MRN: [id number] DOB: March 18, 1990  Reason for Call:  Transition of Care Hospital Discharge Call  Contact Status: Patient Contact Status: Message  Language assistant needed:          Follow-Up Questions:    [place] Postnatal Depression Scale:  In the Past 7 Days:    PHQ2-9 Depression Scale:     Discharge Follow-up:    Post-discharge interventions: NA  Salena Saner, RN 09/09/2022 11:00

## 2022-09-17 DIAGNOSIS — Z1331 Encounter for screening for depression: Secondary | ICD-10-CM | POA: Diagnosis not present

## 2022-09-17 DIAGNOSIS — Z124 Encounter for screening for malignant neoplasm of cervix: Secondary | ICD-10-CM | POA: Diagnosis not present

## 2022-09-17 LAB — RESULTS CONSOLE HPV: CHL HPV: NEGATIVE

## 2022-09-17 LAB — HM PAP SMEAR: HPV, high-risk: NEGATIVE

## 2022-11-27 DIAGNOSIS — M25532 Pain in left wrist: Secondary | ICD-10-CM | POA: Diagnosis not present

## 2023-04-03 ENCOUNTER — Ambulatory Visit (INDEPENDENT_AMBULATORY_CARE_PROVIDER_SITE_OTHER): Admitting: Physician Assistant

## 2023-04-03 ENCOUNTER — Encounter (HOSPITAL_BASED_OUTPATIENT_CLINIC_OR_DEPARTMENT_OTHER): Payer: Self-pay

## 2023-04-03 ENCOUNTER — Ambulatory Visit (HOSPITAL_BASED_OUTPATIENT_CLINIC_OR_DEPARTMENT_OTHER)
Admission: RE | Admit: 2023-04-03 | Discharge: 2023-04-03 | Disposition: A | Discharge status: Home / Self Care | Source: Ambulatory Visit | Attending: Physician Assistant | Admitting: Physician Assistant

## 2023-04-03 ENCOUNTER — Encounter: Payer: Self-pay | Admitting: Physician Assistant

## 2023-04-03 ENCOUNTER — Other Ambulatory Visit: Payer: Self-pay | Admitting: Physician Assistant

## 2023-04-03 VITALS — BP 99/66 | HR 70 | Ht 60.0 in | Wt 176.8 lb

## 2023-04-03 DIAGNOSIS — R102 Pelvic and perineal pain: Secondary | ICD-10-CM

## 2023-04-03 DIAGNOSIS — J342 Deviated nasal septum: Secondary | ICD-10-CM | POA: Diagnosis not present

## 2023-04-03 DIAGNOSIS — M25551 Pain in right hip: Secondary | ICD-10-CM | POA: Diagnosis not present

## 2023-04-03 DIAGNOSIS — M25552 Pain in left hip: Secondary | ICD-10-CM | POA: Diagnosis not present

## 2023-04-03 NOTE — Progress Notes (Signed)
 New patient visit   Patient: Alison Moss   DOB: July 21, 1990   32 y.o. Female  MRN: [id number] Visit Date: 04/03/2023  Today's healthcare provider: Alfredia Ferguson, PA-C   Cc. New pt, nose growth, pelvic pain  Subjective    Alison Moss is a 33 y.o. female who presents today as a new patient to establish care.   Discussed the use of AI scribe software for clinical note transcription with the patient, who gave verbal consent to proceed.  History of Present Illness   The patient, with a history of allergies, presents with a longstanding nasal growth. The growth, described as stalactite-like, is located on the septum, left side of the nose and is irritating, particularly when the patient skips allergy medication. The patient reports that the growth has been present for years and feels it has grown in size. It does not bleed unless manipulated-- which she does sometimes if it gets larger. The patient is currently taking Zyrtec and has previously used Flonase.  The patient also reports persistent pelvic pain following the birth of her child eight months ago.She has had 4 pregnancies, 2 losses, 2 vaginal births. The pain, located on the pubic bone, is described as feeling like the pelvis is going to split in two. The pain is particularly severe when getting up and down from the ground, walking for a long time. This is similar to pain felt while pregnancy, but never improved. The patient denies any radiation of the pain to the hips or any bladder symptoms. However, the patient does report urinary incontinence with jumping, sneezing, and coughing. The patient  is currently breastfeeding.       Past Medical History:  Diagnosis Date   Allergy    Anemia    Anxiety 01/2017   Asthma    Breast cyst    Depression 10/2009   Past Surgical History:  Procedure Laterality Date   BREAST BIOPSY Left    age 89   COSMETIC SURGERY     DILATION AND CURETTAGE OF UTERUS  06/14/2021   NO PAST  SURGERIES     WISDOM TOOTH EXTRACTION  2016   Family Status  Relation Name Status   Mother  Alive   Father  Alive   Jackquline Bosch Alive   MGM Marion Alive   MGF Keene Deceased   PGM Harriett Sine Alive   PGF Broadus John Alive  No partnership data on file   Family History  Problem Relation Age of Onset   Multiple sclerosis Mother    Hearing loss Paternal Uncle    Diabetes Maternal Grandmother    Cancer Maternal Grandfather        prostate   Diabetes Maternal Grandfather    Hearing loss Maternal Grandfather    Diabetes Paternal Grandmother    Stroke Paternal Grandmother    Cancer Paternal Grandfather        prostate   Diabetes Paternal Grandfather    Hearing loss Paternal Grandfather    Social History   Socioeconomic History   Marital status: Married    Spouse name: Richard   Number of children: 1   Years of education: Not on file   Highest education level: Bachelor's degree (e.g., BA, AB, BS)  Occupational History   Not on file  Tobacco Use   Smoking status: Never   Smokeless tobacco: Never  Substance and Sexual Activity   Alcohol use: Not Currently   Drug use: No   Sexual activity: Yes  Birth control/protection: Condom, None  Other Topics Concern   Not on file  Social History Narrative   Not on file   Social Drivers of Health   Financial Resource Strain: Low Risk  (04/02/2023)   Overall Financial Resource Strain (CARDIA)    Difficulty of Paying Living Expenses: Not hard at all  Food Insecurity: No Food Insecurity (04/02/2023)   Hunger Vital Sign    Worried About Running Out of Food in the Last Year: Never true    Ran Out of Food in the Last Year: Never true  Transportation Needs: No Transportation Needs (04/02/2023)   PRAPARE - Administrator, Civil Service (Medical): No    Lack of Transportation (Non-Medical): No  Physical Activity: Unknown (04/02/2023)   Exercise Vital Sign    Days of Exercise per Week: 0 days    Minutes of Exercise per Session:  Not on file  Stress: Stress Concern Present (04/02/2023)   [employer] of Occupational Health - Occupational Stress Questionnaire    Feeling of Stress : To some extent  Social Connections: Moderately Isolated (04/02/2023)   Social Connection and Isolation Panel [NHANES]    Frequency of Communication with Friends and Family: More than three times a week    Frequency of Social Gatherings with Friends and Family: More than three times a week    Attends Religious Services: Never    Database administrator or Organizations: No    Attends Engineer, structural: Not on file    Marital Status: Married   Outpatient Medications Prior to Visit  Medication Sig   cetirizine (ZYRTEC) 10 MG chewable tablet Chew 10 mg by mouth daily.   Cholecalciferol (D3-50) 1.25 MG (50000 UT) capsule TAKE 1 WEEKLY FOR 12 WEEKS   fluticasone (FLONASE) 50 MCG/ACT nasal spray Place 2 sprays into both nostrils daily.   Prenatal Vit-Fe Fumarate-FA (MULTIVITAMIN-PRENATAL) 27-0.8 MG TABS tablet Take 1 tablet by mouth daily at 12 noon.   [DISCONTINUED] acetaminophen (TYLENOL) 500 MG tablet Take 2 tablets (1,000 mg total) by mouth every 6 (six) hours.   [DISCONTINUED] ibuprofen (ADVIL) 600 MG tablet Take 1 tablet (600 mg total) by mouth every 6 (six) hours.   No facility-administered medications prior to visit.   Allergies  Allergen Reactions   Iodine Hives   Shellfish Allergy Hives    Immunization History  Administered Date(s) Administered   Tdap 02/09/2014    Health Maintenance  Topic Date Due   Hepatitis C Screening  Never done   Cervical Cancer Screening (HPV/Pap Cotest)  Never done   INFLUENZA VACCINE  08/15/2022   COVID-19 Vaccine (1 - 2024-25 season) Never done   DTaP/Tdap/Td (2 - Td or Tdap) 02/10/2024   HIV Screening  Completed   HPV VACCINES  Aged Out    Patient Care Team: Alfredia Ferguson, PA-C as PCP - General (Physician Assistant)  Review of Systems  Constitutional:  Negative for  fatigue and fever.  Respiratory:  Negative for cough and shortness of breath.   Cardiovascular:  Negative for chest pain and leg swelling.  Gastrointestinal:  Negative for abdominal pain.  Neurological:  Negative for dizziness and headaches.        Objective    BP 99/66   Pulse 70   Ht 5' (1.524 m)   Wt 176 lb 12.8 oz (80.2 kg)   LMP 03/23/2023   Breastfeeding Yes   BMI 34.53 kg/m     Physical Exam Vitals reviewed.  Constitutional:  Appearance: She is not ill-appearing.  HENT:     Head: Normocephalic.     Nose:     Comments: No obvious masses, there is a prominence of her septum-- very firm on the inner lower septum in her L nare. Eyes:     Conjunctiva/sclera: Conjunctivae normal.  Cardiovascular:     Rate and Rhythm: Normal rate.  Pulmonary:     Effort: Pulmonary effort is normal. No respiratory distress.  Neurological:     Mental Status: She is alert and oriented to person, place, and time.  Psychiatric:        Mood and Affect: Mood normal.        Behavior: Behavior normal.    Depression Screen    04/03/2023    3:30 PM  PHQ 2/9 Scores  PHQ - 2 Score 0   No results found for any visits on 04/03/23.  Assessment & Plan     Pain of pelvic girdle Persistent pelvic pain postpartum, localized to the pubic bone, with a sensation of the pelvis splitting. Pain exacerbated by physical activity SPD during previous pregnancies, with current symptoms persisting 8 months postpartum.   - Order pelvic x-ray  - Refer to pelvic floor therapy   Deformity of nasal septum - Refer to ENT for further evaluation, nothing obvious to treat today -     Ambulatory referral to ENT  Return in about 6 months (around 10/04/2023) for CPE.      Alfredia Ferguson, PA-C  [facility] [phone] (phone) [phone] (fax)  [facility]

## 2023-04-10 ENCOUNTER — Encounter: Payer: Self-pay | Admitting: Physician Assistant

## 2023-04-17 NOTE — Therapy (Unsigned)
 OUTPATIENT PHYSICAL THERAPY FEMALE PELVIC EVALUATION   Patient Name: Alison Moss MRN: [id number] DOB:1990-11-12, 33 y.o., female Today's Date: 04/18/2023  END OF SESSION:  PT End of Session - 04/18/23 0849     Visit Number 1    Date for PT Re-Evaluation 10/18/23    Authorization Type BCBS    Authorization - Visit Number 1    Authorization - Number of Visits 30    PT Start Time 0845    PT Stop Time 0925    PT Time Calculation (min) 40 min    Activity Tolerance Patient tolerated treatment well    Behavior During Therapy [facility] for tasks assessed/performed             Past Medical History:  Diagnosis Date   Allergy    Anemia    Anxiety 01/2017   Asthma    Breast cyst    Depression 10/2009   Past Surgical History:  Procedure Laterality Date   BREAST BIOPSY Left    age 87   COSMETIC SURGERY     DILATION AND CURETTAGE OF UTERUS  06/14/2021   NO PAST SURGERIES     WISDOM TOOTH EXTRACTION  2016   Patient Active Problem List   Diagnosis Date Noted   Postpartum care following vaginal delivery 7/26 08/09/2022   SVD (spontaneous vaginal delivery) 08/08/2022   Vegan diet 11/08/2015   Pre-menstrual syndrome 11/08/2015    PCP: Alfredia Ferguson, PA-C  REFERRING PROVIDER: Alfredia Ferguson, PA-C   REFERRING DIAG: R10.2 (ICD-10-CM) - Pain of pelvic girdle   THERAPY DIAG:  Muscle weakness (generalized)  Other lack of coordination  Pelvic pain  Rationale for Evaluation and Treatment: Rehabilitation  ONSET DATE: 08/09/22  SUBJECTIVE:                                                                                                                                                                                           SUBJECTIVE STATEMENT: Vaginal birth 08/09/22. Majority of pregnancy she layed down due to the pelvic girdle pain.  Persistent pelvic pain postpartum, localized to the pubic bone, with a sensation of the pelvis splitting. Pain exacerbated by physical activity  SPD during previous pregnancies, with current symptoms persisting 8 months postpartum.  Fluid intake:   PAIN:  Are you having pain? Yes NPRS scale: 3/10 with everyday activities: 8/10 with walking Pain location:  pubic symphysis and hips  Pain type: aching and sharp Pain description: constant   Aggravating factors: walking, getting up and down from the floor, squat to pick up child, prolonged sitting Relieving factors: lay with pillow between knees  PRECAUTIONS: None  RED  FLAGS: None   WEIGHT BEARING RESTRICTIONS: No  FALLS:  Has patient fallen in last 6 months? No  OCCUPATION: sitting job  ACTIVITY LEVEL : low activity  PLOF: Independent  PATIENT GOALS: reduce pain, urinary leakage decreased  PERTINENT HISTORY:  See above Sexual abuse: No  BOWEL MOVEMENT: no issues Pain with bowel movement: No  URINATION: Pain with urination: No Fully empty bladder: Yes:   Stream: Strong Urgency: Yes  Frequency: average Leakage: Walking to the bathroom, Coughing, and Sneezing Pads: No  INTERCOURSE:  Ability to have vaginal penetration Yes  Pain with intercourse: none DrynessNo Climax: yes Marinoff Scale: 0/3  PREGNANCY: Vaginal deliveries 2 Tearing Yes: first degree Episiotomy No Currently pregnant No  PROLAPSE: None   OBJECTIVE:  Note: Objective measures were completed at Evaluation unless otherwise noted.  DIAGNOSTIC FINDINGS:  none  PATIENT SURVEYS:  PFIQ-7: 105 UIQ-7: 29 POPIQ-7: 76 COGNITION: Overall cognitive status: Within functional limits for tasks assessed     SENSATION: Light touch: Appears intact  LUMBAR SPECIAL TESTS:  SI Compression/distraction test: Positive on the right  FUNCTIONAL TESTS:   Standing on one leg increased pain but no Trendelenburg   LUMBARAROM/PROM:  A/PROM A/PROM  eval  Flexion Decreased by 50%  Extension Decreased by 50%  Right lateral flexion full  Left lateral flexion full  Right rotation full  Left  rotation Decreased by 25%   (Blank rows = not tested)  LOWER EXTREMITY ROM: left hip ROM feels tight at end range   LOWER EXTREMITY MMT:  MMT Right eval Left eval  Hip extension 4/5 4/5 with pain  Hip abduction 4/5 with pain 4/5 with pain  Hip adduction 4/5 4/5   (Blank rows = not tested) PALPATION:   General: decreased movement of L1-L5; tightness in the left SI joint, sacrum extended  Pelvic Alignment: right ilium is anteriorly rotated  Abdominal: tenderness in the right lower quadrant, along superior pubic bone                External Perineal Exam: tenderness located on the ischiocavernosus, firmness at the perineal body                             Internal Pelvic Floor: tenderness along the right obturator internist, along the sides of the introitus, and left urethra sphincter,  Patient confirms identification and approves PT to assess internal pelvic floor and treatment Yes  PELVIC MMT:   MMT eval  Vaginal 2/5   (Blank rows = not tested)        TONE: average  PROLAPSE: none  TODAY'S TREATMENT:                                                                                                                              DATE: 04/18/23  EVAL Examination completed, findings reviewed, pt educated on POC, HEP, and female pelvic floor anatomy, reasoning with pelvic  floor assessment internally with pt consent. Pt motivated to participate in PT and agreeable to attempt recommendations.     PATIENT EDUCATION:  04/18/23 Education details: Access Code: [email or web address] Person educated: Patient Education method: Explanation, Demonstration, Tactile cues, Verbal cues, and Handouts Education comprehension: verbalized understanding, returned demonstration, verbal cues required, tactile cues required, and needs further education  HOME EXERCISE PROGRAM: Access Code: [email or web address] URL: [email or web address] Date: 04/18/2023 Prepared by: Eulis Foster  Program Notes sitting  with yoga block between knees moving thigh forward and back 10 x 2 times per day.   Exercises - Seated Hip Internal Rotation with Ball and Resistance  - 2 x daily - 7 x weekly - 1 sets - 10 reps - Seated Hip Adduction Isometrics with Ball  - 1 x daily - 7 x weekly - 1 sets - 10 reps - 5 sec hold  ASSESSMENT:  CLINICAL IMPRESSION: Patient is a 33 y.o. female who was seen today for physical therapy evaluation and treatment for pelvic girdle pain. Patient has had symphysis pubic pain since vaginal birth of her last child, 08/09/22.  Pain level is 3-8/10 with walking, getting up and down from the floor, squat to pick up child, prolonged sitting. She will leak urine with walking to the bathroom, coughing, and sneezing. Pelvic floor strength is 2/5 with no hug of therapist finger. Patient has tenderness located on the right obturator internist, levator ani, and sides of the introitus. She has tightness in the lumbar vertebrae. Sacrum is extended and right ilium is anteriorly rotated. Patient will benefit from skilled therapy to reduce her pain and improve her mobility and strength.   OBJECTIVE IMPAIRMENTS: decreased activity tolerance, decreased coordination, increased fascial restrictions, increased muscle spasms, and pain.   ACTIVITY LIMITATIONS: carrying, lifting, bending, squatting, and continence  PARTICIPATION LIMITATIONS: cleaning, driving, shopping, community activity, and occupation  PERSONAL FACTORS: Fitness and Time since onset of injury/illness/exacerbation are also affecting patient's functional outcome.   REHAB POTENTIAL: Excellent  CLINICAL DECISION MAKING: Evolving/moderate complexity  EVALUATION COMPLEXITY: Moderate   GOALS: Goals reviewed with patient? Yes  SHORT TERM GOALS: Target date: 05/16/23  Patient independent with initial HEP.  Baseline: Goal status: INITIAL  2.  Patient understands how to massage the pelvic floor muscles to reduce the tenderness.  Baseline:   Goal status: INITIAL  3.  Patient pelvis in correct alignment for 4 weeks.  Baseline:  Goal status: INITIAL  4.  Patient pubic pain decreased >/= 25% Baseline:  Goal status: INITIAL   LONG TERM GOALS: Target date: 10/18/23  Patient independent with advanced HEP.  Baseline:  Goal status: INITIAL  2.  Pelvic floor strength >/= 3/5 so her urinary leakage is decreased >/= 80%.  Baseline:  Goal status: INITIAL  3.  Patient is able to walk to the bathroom without leaking urine due to pelvic floor strength >/= 3/5.  Baseline:  Goal status: INITIAL  4.  Patient is able to squat to pick up her child with pain decreased >/= 80% due to increased strength and pelvis in alignment.  Baseline:  Goal status: INITIAL  5.  Patient is able to sit for 2 hours with pain decreased >/= 80%.  Baseline:  Goal status: INITIAL   PLAN:  PT FREQUENCY: 1-2x/week  PT DURATION: 1 week  PLANNED INTERVENTIONS: 97110-Therapeutic exercises, 97530- Therapeutic activity, [id number]- Neuromuscular re-education, 97535- Self Care, [id number]- Manual therapy, [id number]- Electrical stimulation (unattended), 97035- Ultrasound, Patient/Family education, Taping, Dry Needling, Joint mobilization, Spinal mobilization, Cryotherapy, Moist heat, and Biofeedback  PLAN FOR NEXT SESSION: correct pelvis, work on core strength, manual work to pelvic floor, show her how to massage the pelvic floor, diaphragmatic breathing.   Eulis Foster, PT 04/18/23 11:44 AM

## 2023-04-18 ENCOUNTER — Ambulatory Visit: Attending: Physician Assistant | Admitting: Physical Therapy

## 2023-04-18 ENCOUNTER — Other Ambulatory Visit: Payer: Self-pay

## 2023-04-18 ENCOUNTER — Encounter: Payer: Self-pay | Admitting: Physical Therapy

## 2023-04-18 DIAGNOSIS — M6281 Muscle weakness (generalized): Secondary | ICD-10-CM | POA: Diagnosis not present

## 2023-04-18 DIAGNOSIS — R102 Pelvic and perineal pain: Secondary | ICD-10-CM | POA: Diagnosis not present

## 2023-04-18 DIAGNOSIS — R278 Other lack of coordination: Secondary | ICD-10-CM | POA: Insufficient documentation

## 2023-04-21 ENCOUNTER — Encounter: Payer: Self-pay | Admitting: Physician Assistant

## 2023-06-13 ENCOUNTER — Institutional Professional Consult (permissible substitution) (INDEPENDENT_AMBULATORY_CARE_PROVIDER_SITE_OTHER): Admitting: Otolaryngology

## 2023-06-18 ENCOUNTER — Ambulatory Visit: Admitting: Physical Therapy

## 2023-07-02 ENCOUNTER — Encounter: Payer: Self-pay | Admitting: Physical Therapy

## 2023-07-02 ENCOUNTER — Ambulatory Visit: Attending: Physician Assistant | Admitting: Physical Therapy

## 2023-07-02 DIAGNOSIS — R278 Other lack of coordination: Secondary | ICD-10-CM | POA: Insufficient documentation

## 2023-07-02 DIAGNOSIS — M6281 Muscle weakness (generalized): Secondary | ICD-10-CM | POA: Diagnosis not present

## 2023-07-02 DIAGNOSIS — R102 Pelvic and perineal pain: Secondary | ICD-10-CM | POA: Insufficient documentation

## 2023-07-02 NOTE — Therapy (Signed)
 OUTPATIENT PHYSICAL THERAPY FEMALE PELVIC EVALUATION   Patient Name: Alison Moss MRN: [id number] DOB:1990-10-27, 33 y.o., female Today's Date: 07/02/2023  END OF SESSION:  PT End of Session - 07/02/23 0936     Visit Number 2    Date for PT Re-Evaluation 10/18/23    Authorization Type BCBS    Authorization - Visit Number 2    Authorization - Number of Visits 30    PT Start Time 0930    PT Stop Time 1010    PT Time Calculation (min) 40 min    Activity Tolerance Patient tolerated treatment well    Behavior During Therapy [facility] for tasks assessed/performed          Past Medical History:  Diagnosis Date   Allergy    Anemia    Anxiety 01/2017   Asthma    Breast cyst    Depression 10/2009   Past Surgical History:  Procedure Laterality Date   BREAST BIOPSY Left    age 75   COSMETIC SURGERY     DILATION AND CURETTAGE OF UTERUS  06/14/2021   NO PAST SURGERIES     WISDOM TOOTH EXTRACTION  2016   Patient Active Problem List   Diagnosis Date Noted   Postpartum care following vaginal delivery 7/26 08/09/2022   SVD (spontaneous vaginal delivery) 08/08/2022   Vegan diet 11/08/2015   Pre-menstrual syndrome 11/08/2015    PCP: Trenton Frock, PA-C  REFERRING PROVIDER: Trenton Frock, PA-C   REFERRING DIAG: R10.2 (ICD-10-CM) - Pain of pelvic girdle   THERAPY DIAG:  Muscle weakness (generalized)  Other lack of coordination  Pelvic pain  Rationale for Evaluation and Treatment: Rehabilitation  ONSET DATE: 08/09/22  SUBJECTIVE:                                                                                                                                                                                           SUBJECTIVE STATEMENT: The alignment helped the pain. Walking and getting up and down from the floor helps.   PAIN:  Are you having pain? Yes NPRS scale: 2/10 with everyday activities: 8/10 with walking 07/02/23: 0-1/10, walking 5/10 Pain location: pubic  symphysis and hips  Pain type: aching and sharp Pain description: constant   Aggravating factors: walking, getting up and down from the floor, squat to pick up child, prolonged sitting Relieving factors: lay with pillow between knees  PRECAUTIONS: None  RED FLAGS: None   WEIGHT BEARING RESTRICTIONS: No  FALLS:  Has patient fallen in last 6 months? No  OCCUPATION: sitting job  ACTIVITY LEVEL : low activity  PLOF: Independent  PATIENT GOALS: reduce  pain, urinary leakage decreased  PERTINENT HISTORY:  See above Sexual abuse: No  BOWEL MOVEMENT: no issues Pain with bowel movement: No  URINATION: Pain with urination: No Fully empty bladder: Yes:   Stream: Strong Urgency: Yes  Frequency: average Leakage: Walking to the bathroom, Coughing, and Sneezing Pads: No  INTERCOURSE:  Ability to have vaginal penetration Yes  Pain with intercourse: none DrynessNo Climax: yes Marinoff Scale: 0/3  PREGNANCY: Vaginal deliveries 2 Tearing Yes: first degree Episiotomy No Currently pregnant No  PROLAPSE: None   OBJECTIVE:  Note: Objective measures were completed at Evaluation unless otherwise noted.  DIAGNOSTIC FINDINGS:  none  PATIENT SURVEYS:  PFIQ-7: 105 UIQ-7: 29 POPIQ-7: 76 COGNITION: Overall cognitive status: Within functional limits for tasks assessed     SENSATION: Light touch: Appears intact  LUMBAR SPECIAL TESTS:  SI Compression/distraction test: Positive on the right  FUNCTIONAL TESTS:   Standing on one leg increased pain but no Trendelenburg   LUMBARAROM/PROM:  A/PROM A/PROM  eval A/PROM  07/02/23  Flexion Decreased by 50% full  Extension Decreased by 50% full  Right lateral flexion full full  Left lateral flexion full full  Right rotation full full  Left rotation Decreased by 25% Decreased by 25%   (Blank rows = not tested)  LOWER EXTREMITY ROM: left hip ROM feels tight at end range   LOWER EXTREMITY MMT:  MMT Right eval  Left eval  Hip extension 4/5 4/5 with pain  Hip abduction 4/5 with pain 4/5 with pain  Hip adduction 4/5 4/5   (Blank rows = not tested) PALPATION:   General: decreased movement of L1-L5; tightness in the left SI joint, sacrum extended  Pelvic Alignment: right ilium is anteriorly rotated 07/02/23: pelvis in correct alignment  Abdominal: tenderness in the right lower quadrant, along superior pubic bone                External Perineal Exam: tenderness located on the ischiocavernosus, firmness at the perineal body                             Internal Pelvic Floor: tenderness along the right obturator internist, along the sides of the introitus, and left urethra sphincter,  Patient confirms identification and approves PT to assess internal pelvic floor and treatment Yes  PELVIC MMT:   MMT eval  Vaginal 2/5   (Blank rows = not tested)        TONE: average  PROLAPSE: none  TODAY'S TREATMENT:    07/02/23 Manual: Soft tissue mobilization: Manual work to the right gluteal where she has a trigger point, laying on left side Spinal mobilization: Mobilization of the left SI joint in supine Gapping of the left side of the T10-L5 to improve mobility  PA mobilization grade 3 to T4-T8 Exercises: Stretches/mobility: Long sit left rotation stretch holding 30 sec x 2 both ways Strengthening: Dead bug  with no weight then with 1# in hand and red band around knees but too easy Dead bug with knee extended with 1# weight in hand and red band around knees then added 2# in each hand Bridge with hip abduction with red band and bring 2# overhead, tried bridge with march but increased right gluteal pain  DATE: 04/18/23  EVAL Examination completed, findings reviewed, pt educated on POC, HEP, and female pelvic floor anatomy, reasoning with pelvic floor assessment internally  with pt consent. Pt motivated to participate in PT and agreeable to attempt recommendations.     PATIENT EDUCATION:  07/02/23 Education details: Access Code: [email or web address] Person educated: Patient Education method: Explanation, Demonstration, Tactile cues, Verbal cues, and Handouts Education comprehension: verbalized understanding, returned demonstration, verbal cues required, tactile cues required, and needs further education  HOME EXERCISE PROGRAM: 07/02/23 Access Code: [email or web address] URL: [email or web address] Date: 07/02/2023 Prepared by: Marsha Skeen  Program Notes sitting with yoga block between knees moving thigh forward and back 10 x 2 times per day.   Exercises - Seated Hip Internal Rotation with Ball and Resistance  - 2 x daily - 7 x weekly - 1 sets - 10 reps - Seated Hip Adduction Isometrics with Ball  - 1 x daily - 7 x weekly - 1 sets - 10 reps - 5 sec hold - Hooklying Transversus Abdominis Palpation  - 1 x daily - 7 x weekly - 1 sets - 10 reps - Supine Dead Bug with Leg Extension  - 1 x daily - 3 x weekly - 2 sets - 10 reps - Bridge with Hip Abduction and Resistance - Ground Touches  - 1 x daily - 3 x weekly - 2 sets - 10 reps  ASSESSMENT:  CLINICAL IMPRESSION: Patient is a 33 y.o. female who was seen today for physical therapy treatment for pelvic girdle pain. Patient has had symphysis pubic pain since vaginal birth of her last child, 08/09/22.  Patient had no pain at the end of treatment. Her pelvis was in correct alignment. She is able to get up and down from the floor with less pain. She is able to walk with less pain. Patient is working on strengthening her core.  Patient will benefit from skilled therapy to reduce her pain and improve her mobility and strength.   OBJECTIVE IMPAIRMENTS: decreased activity tolerance, decreased coordination, increased fascial restrictions, increased muscle spasms, and pain.   ACTIVITY LIMITATIONS: carrying, lifting, bending,  squatting, and continence  PARTICIPATION LIMITATIONS: cleaning, driving, shopping, community activity, and occupation  PERSONAL FACTORS: Fitness and Time since onset of injury/illness/exacerbation are also affecting patient's functional outcome.   REHAB POTENTIAL: Excellent  CLINICAL DECISION MAKING: Evolving/moderate complexity  EVALUATION COMPLEXITY: Moderate   GOALS: Goals reviewed with patient? Yes  SHORT TERM GOALS: Target date: 05/16/23  Patient independent with initial HEP.  Baseline: Goal status: INITIAL  2.  Patient understands how to massage the pelvic floor muscles to reduce the tenderness.  Baseline:  Goal status: INITIAL  3.  Patient pelvis in correct alignment for 4 weeks.  Baseline:  Goal status: INITIAL  4.  Patient pubic pain decreased >/= 25% Baseline:  Goal status: INITIAL   LONG TERM GOALS: Target date: 10/18/23  Patient independent with advanced HEP.  Baseline:  Goal status: INITIAL  2.  Pelvic floor strength >/= 3/5 so her urinary leakage is decreased >/= 80%.  Baseline:  Goal status: INITIAL  3.  Patient is able to walk to the bathroom without leaking urine due to pelvic floor strength >/= 3/5.  Baseline:  Goal status: INITIAL  4.  Patient is able to squat to pick up her child with pain decreased >/= 80% due to increased strength and pelvis in alignment.  Baseline:  Goal status: INITIAL  5.  Patient is able to sit for 2 hours with pain decreased >/=  80%.  Baseline:  Goal status: INITIAL   PLAN:  PT FREQUENCY: 1-2x/week  PT DURATION: 1 week  PLANNED INTERVENTIONS: 97110-Therapeutic exercises, 97530- Therapeutic activity, 97112- Neuromuscular re-education, 97535- Self Care, [id number]- Manual therapy, G0283- Electrical stimulation (unattended), 97035- Ultrasound, Patient/Family education, Taping, Dry Needling, Joint mobilization, Spinal mobilization, Cryotherapy, Moist heat, and Biofeedback  PLAN FOR NEXT SESSION: check pelvis, work on  core strength, manual work to pelvic floor, show her how to massage the pelvic floor, diaphragmatic breathing, put in auth to continue therapy   Marsha Skeen, PT 07/02/23 10:15 AM

## 2023-07-16 ENCOUNTER — Ambulatory Visit: Admitting: Physical Therapy

## 2023-07-30 ENCOUNTER — Ambulatory Visit: Attending: Physician Assistant | Admitting: Physical Therapy

## 2023-07-30 ENCOUNTER — Encounter: Payer: Self-pay | Admitting: Physical Therapy

## 2023-07-30 DIAGNOSIS — R102 Pelvic and perineal pain: Secondary | ICD-10-CM | POA: Diagnosis not present

## 2023-07-30 DIAGNOSIS — R278 Other lack of coordination: Secondary | ICD-10-CM | POA: Insufficient documentation

## 2023-07-30 DIAGNOSIS — M6281 Muscle weakness (generalized): Secondary | ICD-10-CM | POA: Diagnosis not present

## 2023-07-30 NOTE — Therapy (Addendum)
 OUTPATIENT PHYSICAL THERAPY FEMALE PELVIC EVALUATION   Patient Name: Alison Moss MRN: [id number] DOB:[birth year], 33 y.o., female Today's Date: 07/30/2023  END OF SESSION:  PT End of Session - 07/30/23 1016     Visit Number 3    Date for PT Re-Evaluation 10/18/23    Authorization Type [employer]    Authorization - Visit Number 3    Authorization - Number of Visits 30    PT Start Time 1015    PT Stop Time 1055    PT Time Calculation (min) 40 min    Activity Tolerance Patient tolerated treatment well    Behavior During Therapy [facility] for tasks assessed/performed          Past Medical History:  Diagnosis Date   Allergy    Anemia    Anxiety 01/2017   Asthma    Breast cyst    Depression 10/2009   Past Surgical History:  Procedure Laterality Date   BREAST BIOPSY Left    age 40   COSMETIC SURGERY     DILATION AND CURETTAGE OF UTERUS  06/14/2021   NO PAST SURGERIES     WISDOM TOOTH EXTRACTION  2016   Patient Active Problem List   Diagnosis Date Noted   Postpartum care following vaginal delivery 7/26 08/09/2022   SVD (spontaneous vaginal delivery) 08/08/2022   Vegan diet 11/08/2015   Pre-menstrual syndrome 11/08/2015    PCP: Cyndi Shaver, PA-C  REFERRING PROVIDER: Cyndi Shaver, PA-C   REFERRING DIAG: R10.2 (ICD-10-CM) - Pain of pelvic girdle   THERAPY DIAG:  Muscle weakness (generalized)  Other lack of coordination  Pelvic pain  Rationale for Evaluation and Treatment: Rehabilitation  ONSET DATE: 08/09/22  SUBJECTIVE:                                                                                                                                                                                           SUBJECTIVE STATEMENT: I have not been able to do exercises as much but feeling better. The grinding with laying down is lower. I have no pain in the pubic symphysis.    PAIN:  Are you having pain? Yes NPRS scale: 2/10 with everyday activities: 8/10 with  walking 07/02/23: 0-1/10, walking 5/10 07/30/23: 0/10 pain Pain location: pubic symphysis and hips  Pain type: aching and sharp Pain description: constant   Aggravating factors: walking, getting up and down from the floor, squat to pick up child, prolonged sitting Relieving factors: lay with pillow between knees  PRECAUTIONS: None  RED FLAGS: None   WEIGHT BEARING RESTRICTIONS: No  FALLS:  Has patient fallen in last 6 months? No  OCCUPATION: sitting job  ACTIVITY LEVEL : low activity  PLOF: Independent  PATIENT GOALS: reduce pain, urinary leakage decreased  PERTINENT HISTORY:  See above Sexual abuse: No  BOWEL MOVEMENT: no issues Pain with bowel movement: No  URINATION: Pain with urination: No Fully empty bladder: Yes:   Stream: Strong Urgency: Yes  Frequency: average Leakage: Walking to the bathroom, Coughing, and Sneezing 07/30/23: I have to cross my leg when sneeze; no urinary leakage as she is walking  Pads: No  INTERCOURSE:  Ability to have vaginal penetration Yes  Pain with intercourse: none DrynessNo Climax: yes Marinoff Scale: 0/3  PREGNANCY: Vaginal deliveries 2 Tearing Yes: first degree Episiotomy No Currently pregnant No  PROLAPSE: None   OBJECTIVE:  Note: Objective measures were completed at Evaluation unless otherwise noted.  DIAGNOSTIC FINDINGS:  none  PATIENT SURVEYS:  PFIQ-7: 105 UIQ-7: 29 POPIQ-7: 76 COGNITION: Overall cognitive status: Within functional limits for tasks assessed     SENSATION: Light touch: Appears intact  LUMBAR SPECIAL TESTS:  SI Compression/distraction test: Positive on the right  FUNCTIONAL TESTS:   Standing on one leg increased pain but no Trendelenburg   LUMBARAROM/PROM:  A/PROM A/PROM  eval A/PROM  07/02/23  Flexion Decreased by 50% full  Extension Decreased by 50% full  Right lateral flexion full full  Left lateral flexion full full  Right rotation full full  Left rotation Decreased  by 25% Decreased by 25%   (Blank rows = not tested)  LOWER EXTREMITY ROM: left hip ROM feels tight at end range   LOWER EXTREMITY MMT:  MMT Right eval Left eval  Hip extension 4/5 4/5 with pain  Hip abduction 4/5 with pain 4/5 with pain  Hip adduction 4/5 4/5   (Blank rows = not tested) PALPATION:   General: decreased movement of L1-L5; tightness in the left SI joint, sacrum extended  Pelvic Alignment: right ilium is anteriorly rotated 07/02/23: pelvis in correct alignment 07/30/23: pelvis in correct alignment  Abdominal: tenderness in the right lower quadrant, along superior pubic bone                External Perineal Exam: tenderness located on the ischiocavernosus, firmness at the perineal body                             Internal Pelvic Floor: tenderness along the right obturator internist, along the sides of the introitus, and left urethra sphincter,  Patient confirms identification and approves PT to assess internal pelvic floor and treatment Yes  PELVIC MMT:   MMT eval  Vaginal 2/5   (Blank rows = not tested)        TONE: average  PROLAPSE: none  TODAY'S TREATMENT:    07/30/23 Manual: Soft tissue mobilization: Manual work to the right quadratus Spinal mobilization: PA and rotational mobilization to the L4 and L5 vertebrae grade 3 Side glide right side of L4 and L5 Exercises: Stretches/mobility: Quadruped with right nee on yoga block moving forward and back, side to side then cat cow 10 x each the do the other side Strengthening: Lunge side to side with 10# kettlebell working on pelvic floor strength Squatting with 10 # kettle bell with verbal cues to contract the gluteals and hip adductors.  Lunging holding on to the counter with verbal cues to not drop hip and work in range that does not hurt her knee.  Therapeutic activities: Functional strengthening activities: Discussed with patient on carrying her  daughter on the right at times due to her carrying  her daughter on the left mostly.  Stand holding 10 # lunge twist and simulate putting her daughter into the car seat 10 x each way Standing moving 10 # kettle bell up and down as she is simulating putting her daughter in the crib Going up and down the stairs with core and quad contraction holding 10 # weight like she is carrying her child 5 times    07/02/23 Manual: Soft tissue mobilization: Manual work to the right gluteal where she has a trigger point, laying on left side Spinal mobilization: Mobilization of the left SI joint in supine Gapping of the left side of the T10-L5 to improve mobility  PA mobilization grade 3 to T4-T8 Exercises: Stretches/mobility: Long sit left rotation stretch holding 30 sec x 2 both ways Strengthening: Dead bug  with no weight then with 1# in hand and red band around knees but too easy Dead bug with knee extended with 1# weight in hand and red band around knees then added 2# in each hand Bridge with hip abduction with red band and bring 2# overhead, tried bridge with march but increased right gluteal pain                                                                                                                            DATE: 04/18/23  EVAL Examination completed, findings reviewed, pt educated on POC, HEP, and female pelvic floor anatomy, reasoning with pelvic floor assessment internally with pt consent. Pt motivated to participate in PT and agreeable to attempt recommendations.     PATIENT EDUCATION:  07/02/23 Education details: Access Code: [email or web address] Person educated: Patient Education method: Explanation, Demonstration, Tactile cues, Verbal cues, and Handouts Education comprehension: verbalized understanding, returned demonstration, verbal cues required, tactile cues required, and needs further education  HOME EXERCISE PROGRAM: 07/02/23 Access Code: [email or web address] URL: [email or web address] Date: 07/02/2023 Prepared by: Channing Pereyra  Program Notes sitting with yoga block between knees moving thigh forward and back 10 x 2 times per day.   Exercises - Seated Hip Internal Rotation with Ball and Resistance  - 2 x daily - 7 x weekly - 1 sets - 10 reps - Seated Hip Adduction Isometrics with Ball  - 1 x daily - 7 x weekly - 1 sets - 10 reps - 5 sec hold - Hooklying Transversus Abdominis Palpation  - 1 x daily - 7 x weekly - 1 sets - 10 reps - Supine Dead Bug with Leg Extension  - 1 x daily - 3 x weekly - 2 sets - 10 reps - Bridge with Hip Abduction and Resistance - Ground Touches  - 1 x daily - 3 x weekly - 2 sets - 10 reps  ASSESSMENT:  CLINICAL IMPRESSION: Patient is a 33 y.o. female who was seen today for physical therapy treatment for pelvic girdle pain. Patient is not having symphysis pubic pain. She will cross her  legs to prevent urinary leakage when she sneezes.  No urinary leakage as she is walking. She has learned today to contract her pelvic floor and core with daily tasks to make it easier to exercise during the day with her busy schedule. Her pelvis is staying in correct alignment. Patient will benefit from skilled therapy to reduce her pain and improve her mobility and strength.   OBJECTIVE IMPAIRMENTS: decreased activity tolerance, decreased coordination, increased fascial restrictions, increased muscle spasms, and pain.   ACTIVITY LIMITATIONS: carrying, lifting, bending, squatting, and continence  PARTICIPATION LIMITATIONS: cleaning, driving, shopping, community activity, and occupation  PERSONAL FACTORS: Fitness and Time since onset of injury/illness/exacerbation are also affecting patient's functional outcome.   REHAB POTENTIAL: Excellent  CLINICAL DECISION MAKING: Evolving/moderate complexity  EVALUATION COMPLEXITY: Moderate   GOALS: Goals reviewed with patient? Yes  SHORT TERM GOALS: Target date: 05/16/23  Patient independent with initial HEP.  Baseline: Goal status: Met 07/30/23  2.   Patient understands how to massage the pelvic floor muscles to reduce the tenderness.  Baseline:  Goal status: INITIAL  3.  Patient pelvis in correct alignment for 4 weeks.  Baseline:  Goal status: Met 07/30/23  4.  Patient pubic pain decreased >/= 25% Baseline:  Goal status: Met 07/30/23   LONG TERM GOALS: Target date: 10/18/23  Patient independent with advanced HEP.  Baseline:  Goal status: INITIAL  2.  Pelvic floor strength >/= 3/5 so her urinary leakage is decreased >/= 80%.  Baseline:  Goal status: INITIAL  3.  Patient is able to walk to the bathroom without leaking urine due to pelvic floor strength >/= 3/5.  Baseline:  Goal status: INITIAL  4.  Patient is able to squat to pick up her child with pain decreased >/= 80% due to increased strength and pelvis in alignment.  Baseline:  Goal status: INITIAL  5.  Patient is able to sit for 2 hours with pain decreased >/= 80%.  Baseline:  Goal status: met 07/30/23   PLAN:  PT FREQUENCY: 1-2x/week  PT DURATION: 1 week  PLANNED INTERVENTIONS: 97110-Therapeutic exercises, 97530- Therapeutic activity, 97112- Neuromuscular re-education, 97535- Self Care, [id number]- Manual therapy, [id number]- Electrical stimulation (unattended), 97035- Ultrasound, Patient/Family education, Taping, Dry Needling, Joint mobilization, Spinal mobilization, Cryotherapy, Moist heat, and Biofeedback  PLAN FOR NEXT SESSION:  put in auth to continue therapy, up date HEP   Channing Pereyra, PT 07/30/23 11:00 AM  PHYSICAL THERAPY DISCHARGE SUMMARY  Visits from Start of Care: 3  Current functional level related to goals / functional outcomes: See above. Patient called yesterday on 08/12/23 to be discharged.    Remaining deficits: See above.    Education / Equipment: HEP   Patient agrees to discharge. Patient goals were not met. Patient is being discharged due to the patient's request. Thank you for the referral.   Channing Pereyra, PT 08/13/23 8:30 AM

## 2023-08-07 ENCOUNTER — Institutional Professional Consult (permissible substitution) (INDEPENDENT_AMBULATORY_CARE_PROVIDER_SITE_OTHER): Admitting: Otolaryngology

## 2023-08-13 ENCOUNTER — Ambulatory Visit: Admitting: Physical Therapy
